# Patient Record
Sex: Male | Born: 1960
Health system: Southern US, Community
[De-identification: ages and names within clinical notes are randomized; demographics above are authoritative.]

## PROBLEM LIST (undated history)

## (undated) DIAGNOSIS — C801 Malignant (primary) neoplasm, unspecified: Secondary | ICD-10-CM

## (undated) DIAGNOSIS — R0600 Dyspnea, unspecified: Secondary | ICD-10-CM

## (undated) DIAGNOSIS — K219 Gastro-esophageal reflux disease without esophagitis: Secondary | ICD-10-CM

## (undated) DIAGNOSIS — F40298 Other specified phobia: Secondary | ICD-10-CM

## (undated) DIAGNOSIS — I10 Essential (primary) hypertension: Secondary | ICD-10-CM

## (undated) DIAGNOSIS — E079 Disorder of thyroid, unspecified: Secondary | ICD-10-CM

## (undated) DIAGNOSIS — Z974 Presence of external hearing-aid: Secondary | ICD-10-CM

## (undated) DIAGNOSIS — R569 Unspecified convulsions: Secondary | ICD-10-CM

## (undated) HISTORY — PX: HERNIA REPAIR: SHX51

## (undated) HISTORY — PX: MULTIPLE TOOTH EXTRACTIONS: SHX2053

## (undated) HISTORY — PX: FINGER SURGERY: SHX640

## (undated) HISTORY — PX: EYE SURGERY: SHX253

---

## 2017-07-11 DIAGNOSIS — R079 Chest pain, unspecified: Secondary | ICD-10-CM | POA: Diagnosis not present

## 2017-07-11 DIAGNOSIS — R0789 Other chest pain: Secondary | ICD-10-CM | POA: Diagnosis not present

## 2017-07-11 DIAGNOSIS — Z5321 Procedure and treatment not carried out due to patient leaving prior to being seen by health care provider: Secondary | ICD-10-CM | POA: Diagnosis not present

## 2017-07-11 DIAGNOSIS — I160 Hypertensive urgency: Secondary | ICD-10-CM | POA: Diagnosis not present

## 2017-07-12 DIAGNOSIS — R0789 Other chest pain: Secondary | ICD-10-CM | POA: Diagnosis not present

## 2017-07-12 DIAGNOSIS — I10 Essential (primary) hypertension: Secondary | ICD-10-CM | POA: Diagnosis not present

## 2017-07-12 DIAGNOSIS — R079 Chest pain, unspecified: Secondary | ICD-10-CM | POA: Diagnosis not present

## 2017-07-12 DIAGNOSIS — Z87891 Personal history of nicotine dependence: Secondary | ICD-10-CM | POA: Diagnosis not present

## 2017-07-14 DIAGNOSIS — E0789 Other specified disorders of thyroid: Secondary | ICD-10-CM | POA: Diagnosis not present

## 2017-07-14 DIAGNOSIS — K2970 Gastritis, unspecified, without bleeding: Secondary | ICD-10-CM | POA: Diagnosis not present

## 2017-07-14 DIAGNOSIS — R0789 Other chest pain: Secondary | ICD-10-CM | POA: Diagnosis not present

## 2017-07-14 DIAGNOSIS — I10 Essential (primary) hypertension: Secondary | ICD-10-CM | POA: Diagnosis not present

## 2017-07-19 DIAGNOSIS — R079 Chest pain, unspecified: Secondary | ICD-10-CM | POA: Diagnosis not present

## 2017-07-19 DIAGNOSIS — I10 Essential (primary) hypertension: Secondary | ICD-10-CM | POA: Diagnosis not present

## 2017-07-19 DIAGNOSIS — Z8249 Family history of ischemic heart disease and other diseases of the circulatory system: Secondary | ICD-10-CM | POA: Diagnosis not present

## 2017-07-19 DIAGNOSIS — E041 Nontoxic single thyroid nodule: Secondary | ICD-10-CM | POA: Diagnosis not present

## 2017-07-26 ENCOUNTER — Ambulatory Visit (INDEPENDENT_AMBULATORY_CARE_PROVIDER_SITE_OTHER): Payer: BLUE CROSS/BLUE SHIELD | Admitting: Otolaryngology

## 2017-07-26 DIAGNOSIS — D440 Neoplasm of uncertain behavior of thyroid gland: Secondary | ICD-10-CM

## 2017-08-06 ENCOUNTER — Other Ambulatory Visit: Payer: Self-pay | Admitting: Otolaryngology

## 2017-08-18 ENCOUNTER — Other Ambulatory Visit: Payer: Self-pay

## 2017-08-18 ENCOUNTER — Encounter (HOSPITAL_BASED_OUTPATIENT_CLINIC_OR_DEPARTMENT_OTHER): Payer: Self-pay | Admitting: *Deleted

## 2017-08-19 ENCOUNTER — Encounter (HOSPITAL_BASED_OUTPATIENT_CLINIC_OR_DEPARTMENT_OTHER)
Admission: RE | Admit: 2017-08-19 | Discharge: 2017-08-19 | Disposition: A | Discharge status: Home / Self Care | Payer: BLUE CROSS/BLUE SHIELD | Source: Ambulatory Visit | Attending: Otolaryngology | Admitting: Otolaryngology

## 2017-08-19 DIAGNOSIS — I10 Essential (primary) hypertension: Secondary | ICD-10-CM | POA: Diagnosis not present

## 2017-08-19 DIAGNOSIS — E079 Disorder of thyroid, unspecified: Secondary | ICD-10-CM | POA: Insufficient documentation

## 2017-08-19 DIAGNOSIS — Z01812 Encounter for preprocedural laboratory examination: Secondary | ICD-10-CM | POA: Insufficient documentation

## 2017-08-19 LAB — BASIC METABOLIC PANEL
Anion gap: 9 (ref 5–15)
BUN: 23 mg/dL — ABNORMAL HIGH (ref 6–20)
CALCIUM: 9.6 mg/dL (ref 8.9–10.3)
CHLORIDE: 100 mmol/L — AB (ref 101–111)
CO2: 27 mmol/L (ref 22–32)
CREATININE: 1.24 mg/dL (ref 0.61–1.24)
GFR calc Af Amer: >60 mL/min (ref >60)
GFR calc non Af Amer: >60 mL/min (ref >60)
GLUCOSE: 102 mg/dL — AB (ref 65–99)
Potassium: 4.5 mmol/L (ref 3.5–5.1)
Sodium: 136 mmol/L (ref 135–145)

## 2017-08-24 ENCOUNTER — Ambulatory Visit (HOSPITAL_BASED_OUTPATIENT_CLINIC_OR_DEPARTMENT_OTHER)
Admission: RE | Admit: 2017-08-24 | Discharge: 2017-08-25 | Disposition: A | Discharge status: Home / Self Care | Payer: BLUE CROSS/BLUE SHIELD | Source: Ambulatory Visit | Attending: Otolaryngology | Admitting: Otolaryngology

## 2017-08-24 ENCOUNTER — Encounter (HOSPITAL_BASED_OUTPATIENT_CLINIC_OR_DEPARTMENT_OTHER): Payer: Self-pay

## 2017-08-24 ENCOUNTER — Other Ambulatory Visit: Payer: Self-pay

## 2017-08-24 ENCOUNTER — Ambulatory Visit (HOSPITAL_BASED_OUTPATIENT_CLINIC_OR_DEPARTMENT_OTHER): Payer: BLUE CROSS/BLUE SHIELD | Admitting: Anesthesiology

## 2017-08-24 ENCOUNTER — Encounter (HOSPITAL_BASED_OUTPATIENT_CLINIC_OR_DEPARTMENT_OTHER)
Admission: RE | Disposition: A | Discharge status: Home / Self Care | Payer: Self-pay | Source: Ambulatory Visit | Attending: Otolaryngology

## 2017-08-24 DIAGNOSIS — M1990 Unspecified osteoarthritis, unspecified site: Secondary | ICD-10-CM | POA: Diagnosis not present

## 2017-08-24 DIAGNOSIS — D440 Neoplasm of uncertain behavior of thyroid gland: Secondary | ICD-10-CM | POA: Diagnosis not present

## 2017-08-24 DIAGNOSIS — Z8249 Family history of ischemic heart disease and other diseases of the circulatory system: Secondary | ICD-10-CM | POA: Insufficient documentation

## 2017-08-24 DIAGNOSIS — I10 Essential (primary) hypertension: Secondary | ICD-10-CM | POA: Diagnosis not present

## 2017-08-24 DIAGNOSIS — E079 Disorder of thyroid, unspecified: Secondary | ICD-10-CM | POA: Diagnosis present

## 2017-08-24 DIAGNOSIS — C73 Malignant neoplasm of thyroid gland: Secondary | ICD-10-CM | POA: Insufficient documentation

## 2017-08-24 DIAGNOSIS — Z833 Family history of diabetes mellitus: Secondary | ICD-10-CM | POA: Insufficient documentation

## 2017-08-24 DIAGNOSIS — Z79899 Other long term (current) drug therapy: Secondary | ICD-10-CM | POA: Diagnosis not present

## 2017-08-24 DIAGNOSIS — Z9089 Acquired absence of other organs: Secondary | ICD-10-CM

## 2017-08-24 DIAGNOSIS — R51 Headache: Secondary | ICD-10-CM | POA: Diagnosis not present

## 2017-08-24 DIAGNOSIS — E890 Postprocedural hypothyroidism: Secondary | ICD-10-CM

## 2017-08-24 DIAGNOSIS — E0789 Other specified disorders of thyroid: Secondary | ICD-10-CM | POA: Diagnosis not present

## 2017-08-24 DIAGNOSIS — Z9889 Other specified postprocedural states: Secondary | ICD-10-CM

## 2017-08-24 HISTORY — DX: Essential (primary) hypertension: I10

## 2017-08-24 HISTORY — DX: Disorder of thyroid, unspecified: E07.9

## 2017-08-24 HISTORY — PX: THYROIDECTOMY: SHX17

## 2017-08-24 HISTORY — DX: Other specified phobia: F40.298

## 2017-08-24 HISTORY — DX: Dyspnea, unspecified: R06.00

## 2017-08-24 LAB — POCT HEMOGLOBIN-HEMACUE: Hemoglobin: 14.7 g/dL (ref 13.0–17.0)

## 2017-08-24 SURGERY — THYROIDECTOMY
Anesthesia: General | Site: Neck | Laterality: Left

## 2017-08-24 MED ORDER — CEFAZOLIN SODIUM-DEXTROSE 2-3 GM-%(50ML) IV SOLR: INTRAVENOUS | Status: DC | PRN

## 2017-08-24 MED ORDER — KCL IN DEXTROSE-NACL 20-5-0.45 MEQ/L-%-% IV SOLN
INTRAVENOUS | Status: DC
  Administered 2017-08-24: 12:00:00 via INTRAVENOUS
  Filled 2017-08-24: qty 1000

## 2017-08-24 MED ORDER — LISINOPRIL-HYDROCHLOROTHIAZIDE 20-25 MG PO TABS
1.0000 | ORAL_TABLET | Freq: Every day | ORAL | Status: DC
  Administered 2017-08-24: 1 via ORAL

## 2017-08-24 MED ORDER — HYDROMORPHONE HCL 1 MG/ML IJ SOLN
INTRAMUSCULAR | Status: AC
  Filled 2017-08-24: qty 0.5

## 2017-08-24 MED ORDER — ONDANSETRON HCL 4 MG PO TABS: 4.0000 mg | ORAL_TABLET | ORAL | Status: DC | PRN

## 2017-08-24 MED ORDER — FENTANYL CITRATE (PF) 100 MCG/2ML IJ SOLN
INTRAMUSCULAR | Status: AC
  Filled 2017-08-24: qty 2

## 2017-08-24 MED ORDER — ONDANSETRON HCL 4 MG/2ML IJ SOLN: 4.0000 mg | Freq: Once | INTRAMUSCULAR | Status: DC | PRN

## 2017-08-24 MED ORDER — OXYCODONE-ACETAMINOPHEN 5-325 MG PO TABS
1.0000 | ORAL_TABLET | ORAL | Status: DC | PRN
  Administered 2017-08-24 – 2017-08-25 (×5): 1 via ORAL
  Filled 2017-08-24 (×5): qty 1

## 2017-08-24 MED ORDER — AMOXICILLIN 875 MG PO TABS: 875.0000 mg | ORAL_TABLET | Freq: Two times a day (BID) | ORAL | 0 refills | Status: AC

## 2017-08-24 MED ORDER — SODIUM CHLORIDE 0.9 % IV SOLN
INTRAVENOUS | Status: DC | PRN
  Administered 2017-08-24: 40 ug/min via INTRAVENOUS

## 2017-08-24 MED ORDER — CEFAZOLIN SODIUM-DEXTROSE 2-3 GM-%(50ML) IV SOLR
INTRAVENOUS | Status: DC | PRN
  Administered 2017-08-24: 2 g via INTRAVENOUS

## 2017-08-24 MED ORDER — ACETAMINOPHEN 160 MG/5ML PO SOLN: 650.0000 mg | ORAL | Status: DC | PRN

## 2017-08-24 MED ORDER — MORPHINE SULFATE (PF) 2 MG/ML IV SOLN: 2.0000 mg | INTRAVENOUS | Status: DC | PRN

## 2017-08-24 MED ORDER — LACTATED RINGERS IV SOLN
INTRAVENOUS | Status: DC
  Administered 2017-08-24 (×2): via INTRAVENOUS

## 2017-08-24 MED ORDER — DEXAMETHASONE SODIUM PHOSPHATE 10 MG/ML IJ SOLN
INTRAMUSCULAR | Status: AC
  Filled 2017-08-24: qty 1

## 2017-08-24 MED ORDER — LIDOCAINE-EPINEPHRINE 1 %-1:100000 IJ SOLN
INTRAMUSCULAR | Status: DC | PRN
  Administered 2017-08-24: 3.5 mL

## 2017-08-24 MED ORDER — ONDANSETRON HCL 4 MG/2ML IJ SOLN: 4.0000 mg | INTRAMUSCULAR | Status: DC | PRN

## 2017-08-24 MED ORDER — LIDOCAINE 2% (20 MG/ML) 5 ML SYRINGE
INTRAMUSCULAR | Status: DC | PRN
  Administered 2017-08-24: 100 mg via INTRAVENOUS

## 2017-08-24 MED ORDER — PROPOFOL 10 MG/ML IV BOLUS
INTRAVENOUS | Status: DC | PRN
  Administered 2017-08-24: 150 mg via INTRAVENOUS
  Administered 2017-08-24: 50 mg via INTRAVENOUS

## 2017-08-24 MED ORDER — PHENOL 1.4 % MT LIQD
2.0000 | OROMUCOSAL | Status: DC | PRN
  Administered 2017-08-24: 2 via OROMUCOSAL
  Filled 2017-08-24: qty 177

## 2017-08-24 MED ORDER — ONDANSETRON HCL 4 MG/2ML IJ SOLN
INTRAMUSCULAR | Status: AC
  Filled 2017-08-24: qty 2

## 2017-08-24 MED ORDER — MEPERIDINE HCL 25 MG/ML IJ SOLN: 6.2500 mg | INTRAMUSCULAR | Status: DC | PRN

## 2017-08-24 MED ORDER — MIDAZOLAM HCL 2 MG/2ML IJ SOLN
INTRAMUSCULAR | Status: AC
  Filled 2017-08-24: qty 2

## 2017-08-24 MED ORDER — CEFAZOLIN SODIUM-DEXTROSE 2-4 GM/100ML-% IV SOLN
INTRAVENOUS | Status: AC
  Filled 2017-08-24: qty 100

## 2017-08-24 MED ORDER — DEXAMETHASONE SODIUM PHOSPHATE 4 MG/ML IJ SOLN
INTRAMUSCULAR | Status: DC | PRN
  Administered 2017-08-24: 10 mg via INTRAVENOUS

## 2017-08-24 MED ORDER — PHENYLEPHRINE HCL 10 MG/ML IJ SOLN
INTRAMUSCULAR | Status: DC | PRN
  Administered 2017-08-24 (×2): 120 ug via INTRAVENOUS

## 2017-08-24 MED ORDER — OXYCODONE-ACETAMINOPHEN 5-325 MG PO TABS: 1.0000 | ORAL_TABLET | ORAL | 0 refills | Status: DC | PRN

## 2017-08-24 MED ORDER — ONDANSETRON HCL 4 MG/2ML IJ SOLN
INTRAMUSCULAR | Status: DC | PRN
  Administered 2017-08-24 (×2): 4 mg via INTRAVENOUS

## 2017-08-24 MED ORDER — HYDROMORPHONE HCL 1 MG/ML IJ SOLN
0.2500 mg | INTRAMUSCULAR | Status: DC | PRN
  Administered 2017-08-24 (×2): 0.5 mg via INTRAVENOUS

## 2017-08-24 MED ORDER — FENTANYL CITRATE (PF) 100 MCG/2ML IJ SOLN
50.0000 ug | INTRAMUSCULAR | Status: AC | PRN
  Administered 2017-08-24: 25 ug via INTRAVENOUS
  Administered 2017-08-24: 100 ug via INTRAVENOUS
  Administered 2017-08-24 (×4): 25 ug via INTRAVENOUS

## 2017-08-24 MED ORDER — SUCCINYLCHOLINE CHLORIDE 200 MG/10ML IV SOSY
PREFILLED_SYRINGE | INTRAVENOUS | Status: DC | PRN
  Administered 2017-08-24: 140 mg via INTRAVENOUS

## 2017-08-24 MED ORDER — ACETAMINOPHEN 650 MG RE SUPP: 650.0000 mg | RECTAL | Status: DC | PRN

## 2017-08-24 MED ORDER — LIDOCAINE 2% (20 MG/ML) 5 ML SYRINGE
INTRAMUSCULAR | Status: AC
  Filled 2017-08-24: qty 5

## 2017-08-24 MED ORDER — MIDAZOLAM HCL 2 MG/2ML IJ SOLN
1.0000 mg | INTRAMUSCULAR | Status: DC | PRN
  Administered 2017-08-24: 2 mg via INTRAVENOUS

## 2017-08-24 MED ORDER — SCOPOLAMINE 1 MG/3DAYS TD PT72: 1.0000 | MEDICATED_PATCH | Freq: Once | TRANSDERMAL | Status: DC | PRN

## 2017-08-24 SURGICAL SUPPLY — 60 items
ATTRACTOMAT 16X20 MAGNETIC DRP (DRAPES) ×2 IMPLANT
BLADE CLIPPER SURG (BLADE) ×2 IMPLANT
BLADE SURG 10 STRL SS (BLADE) IMPLANT
BLADE SURG 15 STRL LF DISP TIS (BLADE) ×1 IMPLANT
BLADE SURG 15 STRL SS (BLADE) ×1
CANISTER SUCT 1200ML W/VALVE (MISCELLANEOUS) ×2 IMPLANT
CLIP VESOCCLUDE SM WIDE 6/CT (CLIP) IMPLANT
CORD BIPOLAR FORCEPS 12FT (ELECTRODE) ×2 IMPLANT
COVER BACK TABLE 60X90IN (DRAPES) ×2 IMPLANT
COVER MAYO STAND STRL (DRAPES) ×2 IMPLANT
DECANTER SPIKE VIAL GLASS SM (MISCELLANEOUS) IMPLANT
DERMABOND ADVANCED (GAUZE/BANDAGES/DRESSINGS) ×1
DERMABOND ADVANCED .7 DNX12 (GAUZE/BANDAGES/DRESSINGS) ×1 IMPLANT
DRAIN CHANNEL 10F 3/8 F FF (DRAIN) ×2 IMPLANT
DRAIN CHANNEL 7F FF FLAT (WOUND CARE) IMPLANT
DRAPE U-SHAPE 76X120 STRL (DRAPES) ×2 IMPLANT
ELECT COATED BLADE 2.86 ST (ELECTRODE) ×2 IMPLANT
ELECT REM PT RETURN 9FT ADLT (ELECTROSURGICAL) ×2
ELECTRODE REM PT RTRN 9FT ADLT (ELECTROSURGICAL) ×1 IMPLANT
EVACUATOR SILICONE 100CC (DRAIN) ×2 IMPLANT
FORCEPS BIPOLAR SPETZLER 8 1.0 (NEUROSURGERY SUPPLIES) ×2 IMPLANT
GAUZE SPONGE 4X4 12PLY STRL LF (GAUZE/BANDAGES/DRESSINGS) IMPLANT
GAUZE SPONGE 4X4 16PLY XRAY LF (GAUZE/BANDAGES/DRESSINGS) ×8 IMPLANT
GLOVE BIO SURGEON STRL SZ 6.5 (GLOVE) ×4 IMPLANT
GLOVE BIO SURGEON STRL SZ7.5 (GLOVE) ×2 IMPLANT
GLOVE BIOGEL PI IND STRL 7.0 (GLOVE) ×1 IMPLANT
GLOVE BIOGEL PI INDICATOR 7.0 (GLOVE) ×1
GLOVE SURG SS PI 7.0 STRL IVOR (GLOVE) ×2 IMPLANT
GOWN STRL REUS W/ TWL LRG LVL3 (GOWN DISPOSABLE) ×3 IMPLANT
GOWN STRL REUS W/TWL LRG LVL3 (GOWN DISPOSABLE) ×3
HEMOSTAT SURGICEL 2X14 (HEMOSTASIS) IMPLANT
NEEDLE HYPO 25X1 1.5 SAFETY (NEEDLE) ×2 IMPLANT
NS IRRIG 1000ML POUR BTL (IV SOLUTION) ×2 IMPLANT
PACK BASIN DAY SURGERY FS (CUSTOM PROCEDURE TRAY) ×2 IMPLANT
PENCIL BUTTON HOLSTER BLD 10FT (ELECTRODE) ×2 IMPLANT
PIN SAFETY STERILE (MISCELLANEOUS) IMPLANT
PROBE NERVBE PRASS .33 (MISCELLANEOUS) ×2 IMPLANT
SHEARS HARMONIC 9CM CVD (BLADE) ×2 IMPLANT
SLEEVE SCD COMPRESS KNEE MED (MISCELLANEOUS) ×2 IMPLANT
SPONGE INTESTINAL PEANUT (DISPOSABLE) ×2 IMPLANT
STAPLER VISISTAT 35W (STAPLE) IMPLANT
SUT ETHILON 3 0 PS 1 (SUTURE) ×2 IMPLANT
SUT PROLENE 5 0 P 3 (SUTURE) IMPLANT
SUT SILK 2 0 SH (SUTURE) ×2 IMPLANT
SUT SILK 2 0 TIES 17X18 (SUTURE)
SUT SILK 2-0 18XBRD TIE BLK (SUTURE) IMPLANT
SUT SILK 3 0 TIES 17X18 (SUTURE) ×2
SUT SILK 3-0 18XBRD TIE BLK (SUTURE) ×2 IMPLANT
SUT VIC AB 3-0 FS2 27 (SUTURE) ×2 IMPLANT
SUT VICRYL 4-0 PS2 18IN ABS (SUTURE) ×4 IMPLANT
SYR BULB 3OZ (MISCELLANEOUS) ×2 IMPLANT
SYR CONTROL 10ML LL (SYRINGE) ×2 IMPLANT
TOWEL OR 17X24 6PK STRL BLUE (TOWEL DISPOSABLE) ×4 IMPLANT
TRAY DSU PREP LF (CUSTOM PROCEDURE TRAY) ×2 IMPLANT
TUBE CONNECTING 20X1/4 (TUBING) ×2 IMPLANT
TUBE ENDOTRAC NIMS EMG 6MM (MISCELLANEOUS) IMPLANT
TUBE ENDOTRAC NIMS EMG 7MM (MISCELLANEOUS) IMPLANT
TUBE ENDOTRAC NIMS EMG 8MM (MISCELLANEOUS) IMPLANT
TUBE ENDOTRAC NIMS EMG 9MM (MISCELLANEOUS) ×2 IMPLANT
YANKAUER SUCT BULB TIP NO VENT (SUCTIONS) ×2 IMPLANT

## 2017-08-24 NOTE — H&P (Signed)
Cc: Large thyroid nodule  HPI: The patient is a 57 y/o male who presents today with his wife for evaluation of a newly diagnosed thyroid nodule. The patient is seen in consultation requested by [place]. The patient noted onset of chest discomfort two weeks ago. He has been to the ER several times since onset of symptoms. During his exam a large left thyroid mass was noted. Subsequent ultrasound showed a 6.3 cm left solid thyroid nodule. The patient has undergone cardiac testing with no abnormalities noted. He complains of chest fullness and loss of breath when lying down. No throat pain is noted but he does occasionally feel like his food gets stuck. The patient has been having some reflux symptoms. His chest pain was felt to be musculoskeletal in origin. No previous ENT surgery is noted.   The patient's review of systems (constitutional, eyes, ENT, cardiovascular, respiratory, GI, musculoskeletal, skin, neurologic, psychiatric, endocrine, hematologic, allergic) is noted in the ROS questionnaire.  It is reviewed with the patient and his wife.   Family health history: Diabetes, heart disease.  Major events: Hernia repair X 2.  Ongoing medical problems: Arthritis, hypertension, chest pain, headache.  Social history: The patient is married. He denies the use of tobacco, alcohol or illegal drugs.   Exam General: Communicates without difficulty, well nourished, no acute distress. Head: Normocephalic, no evidence injury, no tenderness, facial buttresses intact without stepoff. Eyes: PERRL, EOMI. No scleral icterus, conjunctivae clear. Neuro: CN II exam reveals vision grossly intact.  No nystagmus at any point of gaze. Ears: Auricles well formed without lesions.  Ear canals are intact without mass or lesion.  No erythema or edema is appreciated.  The TMs are intact without fluid. Nose: External evaluation reveals normal support and skin without lesions.  Dorsum is intact.  Anterior rhinoscopy  reveals healthy pink mucosa over anterior aspect of inferior turbinates and intact septum.  No purulence noted. Oral:  Oral cavity and oropharynx are intact, symmetric, without erythema or edema.  Mucosa is moist without lesions. Neck: Full range of motion without pain.  There is no significant lymphadenopathy.  No masses palpable.  Thyroid bed with large left thyroid mass.  Parotid glands and submandibular glands equal bilaterally without mass.  Trachea is midline.   Assessment The patient is noted to have a 6.3 cm left thyroid nodule which is causing some compressive symptoms. The rest of his ENT exam is normal.  Plan  1. The physical exam and ultrasound findings are reviewed extensively with the patient and his wife.  2. Treatment options are discussed. In light of the size of the nodule and the compressive symptoms, left hemithyroid is recommended. The risks, benefits, alternatives, and details of the procedure are reviewed with the patient and his wife. Questions are invited and answered. 3. The patient is interested in proceeding with the procedure.  We will schedule the procedure in accordance with the family schedule.

## 2017-08-24 NOTE — H&P (View-Only) (Signed)
Cc: Large thyroid nodule  HPI: The patient is a 57 y/o male who presents today with his wife for evaluation of a newly diagnosed thyroid nodule. The patient is seen in consultation requested by [place]. The patient noted onset of chest discomfort two weeks ago. He has been to the ER several times since onset of symptoms. During his exam a large left thyroid mass was noted. Subsequent ultrasound showed a 6.3 cm left solid thyroid nodule. The patient has undergone cardiac testing with no abnormalities noted. He complains of chest fullness and loss of breath when lying down. No throat pain is noted but he does occasionally feel like his food gets stuck. The patient has been having some reflux symptoms. His chest pain was felt to be musculoskeletal in origin. No previous ENT surgery is noted.   The patient's review of systems (constitutional, eyes, ENT, cardiovascular, respiratory, GI, musculoskeletal, skin, neurologic, psychiatric, endocrine, hematologic, allergic) is noted in the ROS questionnaire.  It is reviewed with the patient and his wife.   Family health history: Diabetes, heart disease.  Major events: Hernia repair X 2.  Ongoing medical problems: Arthritis, hypertension, chest pain, headache.  Social history: The patient is married. He denies the use of tobacco, alcohol or illegal drugs.   Exam General: Communicates without difficulty, well nourished, no acute distress. Head: Normocephalic, no evidence injury, no tenderness, facial buttresses intact without stepoff. Eyes: PERRL, EOMI. No scleral icterus, conjunctivae clear. Neuro: CN II exam reveals vision grossly intact.  No nystagmus at any point of gaze. Ears: Auricles well formed without lesions.  Ear canals are intact without mass or lesion.  No erythema or edema is appreciated.  The TMs are intact without fluid. Nose: External evaluation reveals normal support and skin without lesions.  Dorsum is intact.  Anterior rhinoscopy  reveals healthy pink mucosa over anterior aspect of inferior turbinates and intact septum.  No purulence noted. Oral:  Oral cavity and oropharynx are intact, symmetric, without erythema or edema.  Mucosa is moist without lesions. Neck: Full range of motion without pain.  There is no significant lymphadenopathy.  No masses palpable.  Thyroid bed with large left thyroid mass.  Parotid glands and submandibular glands equal bilaterally without mass.  Trachea is midline.   Assessment The patient is noted to have a 6.3 cm left thyroid nodule which is causing some compressive symptoms. The rest of his ENT exam is normal.  Plan  1. The physical exam and ultrasound findings are reviewed extensively with the patient and his wife.  2. Treatment options are discussed. In light of the size of the nodule and the compressive symptoms, left hemithyroid is recommended. The risks, benefits, alternatives, and details of the procedure are reviewed with the patient and his wife. Questions are invited and answered. 3. The patient is interested in proceeding with the procedure.  We will schedule the procedure in accordance with the family schedule.

## 2017-08-24 NOTE — Transfer of Care (Signed)
Immediate Anesthesia Transfer of Care Note  Patient: Carlos Payne  Procedure(s) Performed: LEFT HEMI THYROIDECTOMY (Left Neck)  Patient Location: PACU  Anesthesia Type:General  Level of Consciousness: awake and patient cooperative  Airway & Oxygen Therapy: Patient Spontanous Breathing and Patient connected to face mask oxygen  Post-op Assessment: Report given to RN and Post -op Vital signs reviewed and stable  Post vital signs: Reviewed and stable  Last Vitals:  Vitals:   08/24/17 0747 08/24/17 1117  BP: (!) 142/82   Pulse: 95 (!) (P) 102  Resp: 20 15  Temp: 36.6 C (P) 37.6 C  SpO2: 97% 97%    Last Pain:  Vitals:   08/24/17 0747  TempSrc: Oral         Complications: No apparent anesthesia complications

## 2017-08-24 NOTE — Discharge Instructions (Addendum)
Thyroidectomy, Care After Refer to this sheet in the next few weeks. These instructions provide you with information about caring for yourself after your procedure. Your health care provider may also give you more specific instructions. Your treatment has been planned according to current medical practices, but problems sometimes occur. Call your health care provider if you have any problems or questions after your procedure. What can I expect after the procedure? After your procedure, it is typical to have:  Mild pain in the neck or upper body, especially when swallowing.  A sore throat.  A weak voice.  Follow these instructions at home:  Take medicines only as directed by your health care provider.  Do not take medicines that contain aspirin and ibuprofen until your health care provider says that you can. These medicines can increase your risk of bleeding.  Some pain medicines cause constipation. Drink enough fluid to keep your urine clear or pale yellow. This can help to prevent constipation.  Start slowly with eating. You may need to have only liquids and soft foods for a few days or as directed by your health care provider.  There are many different ways to close and cover an incision, including stitches (sutures), skin glue, and adhesive strips. Follow your health care provider's instructions for: ? Incision care. ? Bandage (dressing) changes and removal. ? Incision closure removal.  Resume your usual activities as directed by your health care provider.  For the first 10 days after the procedure or as instructed by your health care provider: ? Do not lift anything heavier than 20 lb (9.1 kg). ? Do not jog, swim, or do other strenuous exercises. ? Do not play contact sports.  Keep all follow-up visits as directed by your health care provider. This is important. Contact a health care provider if:  The soreness in your throat gets worse.  You have increased pain at your  incision or incisions.  You have increased bleeding from an incision.  Your incision becomes infected. Watch for: ? Swelling. ? Redness. ? Warmth. ? Pus.  You notice a bad smell coming from an incision or dressing.  You have a fever.  You feel lightheaded or faint.  You have numbness, tingling, or muscle spasms in your: ? Arms. ? Hands. ? Feet. ? Face.  You have trouble swallowing. Get help right away if:  You develop a rash.  You have difficulty breathing.  You hear whistling noises coming from your chest.  You develop a cough that gets worse.  Your speech changes, or you have hoarseness that gets worse. This information is not intended to replace advice given to you by your health care provider. Make sure you discuss any questions you have with your health care provider. Document Released: 02/13/2005 Document Revised: 03/29/2016 Document Reviewed: 12/27/2013 Elsevier Interactive Patient Education  2018 [facility] [facility] Instructions  Activity: Get plenty of rest for the remainder of the day. A responsible individual must stay with you for 24 hours following the procedure.  For the next 24 hours, DO NOT: -Drive a car -Paediatric nurse -Drink alcoholic beverages -Take any medication unless instructed by your physician -Make any legal decisions or sign important papers.  Meals: Start with liquid foods such as gelatin or soup. Progress to regular foods as tolerated. Avoid greasy, spicy, heavy foods. If nausea and/or vomiting occur, drink only clear liquids until the nausea and/or vomiting subsides. Call your physician if vomiting continues.  Special Instructions/Symptoms: Your throat  may feel dry or sore from the anesthesia or the breathing tube placed in your throat during surgery. If this causes discomfort, gargle with warm salt water. The discomfort should disappear within 24 hours.  If you had a scopolamine patch placed behind your  ear for the management of post- operative nausea and/or vomiting:  1. The medication in the patch is effective for 72 hours, after which it should be removed.  Wrap patch in a tissue and discard in the trash. Wash hands thoroughly with soap and water. 2. You may remove the patch earlier than 72 hours if you experience unpleasant side effects which may include dry mouth, dizziness or visual disturbances. 3. Avoid touching the patch. Wash your hands with soap and water after contact with the patch.

## 2017-08-24 NOTE — Anesthesia Postprocedure Evaluation (Signed)
Anesthesia Post Note  Patient: Carlos Payne  Procedure(s) Performed: LEFT HEMI THYROIDECTOMY (Left Neck)     Patient location during evaluation: PACU Anesthesia Type: General Level of consciousness: awake and alert Pain management: pain level controlled Vital Signs Assessment: post-procedure vital signs reviewed and stable Respiratory status: spontaneous breathing, nonlabored ventilation, respiratory function stable and patient connected to nasal cannula oxygen Cardiovascular status: blood pressure returned to baseline and stable Postop Assessment: no apparent nausea or vomiting Anesthetic complications: no    Last Vitals:  Vitals:   08/24/17 1429 08/24/17 1430  BP:    Pulse: 90 89  Resp:    Temp:    SpO2: (!) 89% 96%    Last Pain:  Vitals:   08/24/17 1430  TempSrc:   PainSc: 5                  Ryan P Ellender

## 2017-08-24 NOTE — Anesthesia Preprocedure Evaluation (Signed)
Anesthesia Evaluation  Patient identified by MRN, date of birth, ID band Patient awake    Reviewed: Allergy & Precautions, NPO status , Patient's Chart, lab work & pertinent test results  Airway Mallampati: I  TM Distance: >3 FB Neck ROM: Full    Dental   Pulmonary    Pulmonary exam normal        Cardiovascular hypertension, Pt. on medications Normal cardiovascular exam     Neuro/Psych    GI/Hepatic   Endo/Other    Renal/GU      Musculoskeletal   Abdominal   Peds  Hematology   Anesthesia Other Findings   Reproductive/Obstetrics                             Anesthesia Physical Anesthesia Plan  ASA: II  Anesthesia Plan: General   Post-op Pain Management:    Induction: Intravenous  PONV Risk Score and Plan: 2 and Ondansetron and Dexamethasone  Airway Management Planned: Oral ETT  Additional Equipment:   Intra-op Plan:   Post-operative Plan: Extubation in OR  Informed Consent: I have reviewed the patients History and Physical, chart, labs and discussed the procedure including the risks, benefits and alternatives for the proposed anesthesia with the patient or authorized representative who has indicated his/her understanding and acceptance.     Plan Discussed with: CRNA and Surgeon  Anesthesia Plan Comments:         Anesthesia Quick Evaluation

## 2017-08-24 NOTE — Anesthesia Procedure Notes (Addendum)
Procedure Name: Intubation Date/Time: 08/24/2017 8:55 AM Performed by: Lyndee Leo, CRNA Pre-anesthesia Checklist: Patient identified, Emergency Drugs available, Suction available and Patient being monitored Patient Re-evaluated:Patient Re-evaluated prior to induction Oxygen Delivery Method: Circle system utilized Preoxygenation: Pre-oxygenation with 100% oxygen Induction Type: IV induction Ventilation: Mask ventilation without difficulty Laryngoscope Size: Mac and 4 Grade View: Grade III Tube type: Oral Tube size: 9.0 mm Number of attempts: 1 Airway Equipment and Method: Stylet and Oral airway Placement Confirmation: ETT inserted through vocal cords under direct vision,  positive ETCO2 and breath sounds checked- equal and bilateral Secured at: 22 cm Tube secured with: Tape Dental Injury: Teeth and Oropharynx as per pre-operative assessment  Difficulty Due To: Difficulty was anticipated and Difficult Airway- due to reduced neck mobility

## 2017-08-24 NOTE — Op Note (Signed)
DATE OF PROCEDURE:  08/24/2017                              OPERATIVE REPORT  SURGEON:  Leta Baptist, MD  PREOPERATIVE DIAGNOSES: 1. Left thyroid mass  POSTOPERATIVE DIAGNOSES: 1. Left thyroid mass  PROCEDURE PERFORMED:  Left total thyroid lobectomy  ANESTHESIA:  General endotracheal tube anesthesia.  COMPLICATIONS:  None.  ESTIMATED BLOOD LOSS:  400 ml.  INDICATION FOR PROCEDURE:  Carlos Payne is a 57 y.o. male who was recently diagnosed with a left thyroid mass. The patient first noted onset of chest discomfort one month ago. He was seen at the emergency room several times since the onset of his symptoms. An ultrasound evaluation showed a large 6.3 cm left thyroid nodule. The patient's cardiac evaluation was negative. He continued to have a pressure sensation in his chest and his throat. His symptoms were worse when he was in a supine position. On examination, the patient was noted to have a large left thyroid lobe, compressing on his trachea and esophagus. Based on the above findings, the decision was made for the patient to undergo the hemithyroidectomy procedure. Likelihood of success in reducing symptoms was also discussed.  The risks, benefits, alternatives, and details of the procedure were discussed with the patient.  Questions were invited and answered.  Informed consent was obtained.  DESCRIPTION:  The patient was taken to the operating room and placed supine on the operating table.  General endotracheal tube anesthesia was administered by the anesthesiologist.  The patient was positioned and prepped and draped in a standard fashion for thyroidectomy surgery.  1% lidocaine with 1-100,000 epinephrine was infiltrated at the planned site of incision. A lower neck transverse incision was made. The incision was carried down to the level of the platysma muscles. Superior and inferiorly based subplatysmal flaps were elevated in the standard fashion. The strap muscles were retracted laterally,  exposing a large left thyroid lobe. Careful dissection was carried out to free the left thyroid lobe from the surrounding soft tissue. The middle thyroid vein and the superior vascular bundles were suture-ligated. A parathyroid gland was noted inferiorly and preserved. The left recurrent laryngeal nerve was also identified and preserved. The nerve was functional throughout the case. The entire left thyroid lobe was excised and sent to the pathology department for permanent histologic identification. The surgical site was copiously irrigated. A #10 JP drain was placed. The incision was closed in layers with 4-0 Vicryl and Dermabond.  The care of the patient was turned over to the anesthesiologist.  The patient was awakened from anesthesia without difficulty.  The patient was extubated and transferred to the recovery room in good condition.  OPERATIVE FINDINGS:  A large 6 cm left thyroid mass  SPECIMEN:  Left thyroid lobe.  FOLLOWUP CARE:  The patient will be admitted for overnight observation. He will most likely be discharged home on postop day #1.  Carlos Payne 08/24/2017 11:21 AM

## 2017-08-25 ENCOUNTER — Encounter (HOSPITAL_BASED_OUTPATIENT_CLINIC_OR_DEPARTMENT_OTHER): Payer: Self-pay | Admitting: Otolaryngology

## 2017-08-25 DIAGNOSIS — Z8249 Family history of ischemic heart disease and other diseases of the circulatory system: Secondary | ICD-10-CM | POA: Diagnosis not present

## 2017-08-25 DIAGNOSIS — Z79899 Other long term (current) drug therapy: Secondary | ICD-10-CM | POA: Diagnosis not present

## 2017-08-25 DIAGNOSIS — I10 Essential (primary) hypertension: Secondary | ICD-10-CM | POA: Diagnosis not present

## 2017-08-25 DIAGNOSIS — R51 Headache: Secondary | ICD-10-CM | POA: Diagnosis not present

## 2017-08-25 DIAGNOSIS — C73 Malignant neoplasm of thyroid gland: Secondary | ICD-10-CM | POA: Diagnosis not present

## 2017-08-25 DIAGNOSIS — M1990 Unspecified osteoarthritis, unspecified site: Secondary | ICD-10-CM | POA: Diagnosis not present

## 2017-08-25 DIAGNOSIS — Z833 Family history of diabetes mellitus: Secondary | ICD-10-CM | POA: Diagnosis not present

## 2017-08-25 NOTE — Discharge Summary (Signed)
Physician Discharge Summary  Patient ID: Carlos Payne MRN: [id number] DOB/AGE: Apr 26, 1961 57 y.o.  Admit date: 08/24/2017 Discharge date: 08/25/2017  Admission Diagnoses: Left thyroid mass  Discharge Diagnoses: Left thyroid mass Active Problems:   S/P partial thyroidectomy   Discharged Condition: good  Hospital Course: Pt had an uneventful overnight stay. Pt tolerated po well. No bleeding. No stridor. Voice is strong.  Consults: None  Significant Diagnostic Studies: None  Treatments: surgery: Left hemithyroidectomy  Discharge Exam: Blood pressure 124/81, pulse 80, temperature (!) 97.5 F (36.4 C), resp. rate 18, height 5\' 8"  (1.727 m), weight 115.2 kg (254 lb), SpO2 97 %. Incision/Wound:c/d/i  Disposition: Final discharge disposition not confirmed  Discharge Instructions    Activity as tolerated - No restrictions   Complete by:  As directed    Diet general   Complete by:  As directed      Allergies as of 08/25/2017   No Known Allergies     Medication List    TAKE these medications   acetaminophen 325 MG tablet Commonly known as:  TYLENOL Take 650 mg by mouth every 6 (six) hours as needed.   amoxicillin 875 MG tablet Commonly known as:  AMOXIL Take 1 tablet (875 mg total) by mouth 2 (two) times daily for 5 days.   lisinopril-hydrochlorothiazide 20-25 MG tablet Commonly known as:  PRINZIDE,ZESTORETIC Take 1 tablet by mouth daily.   oxyCODONE-acetaminophen 5-325 MG tablet Commonly known as:  ROXICET Take 1-2 tablets by mouth every 4 (four) hours as needed for severe pain.   ROLAIDS PLUS GAS RELIEF EX ST PO Take by mouth.      Follow-up Information    Leta Baptist, MD Follow up on 09/02/2017.   Specialty:  Otolaryngology Why:  at 1:20pm Contact information: [address] [place] [address] [phone]           Signed: Burley Saver 08/25/2017, 8:16 AM

## 2017-08-27 ENCOUNTER — Other Ambulatory Visit: Payer: Self-pay | Admitting: Otolaryngology

## 2017-09-02 ENCOUNTER — Encounter (HOSPITAL_COMMUNITY): Payer: Self-pay | Admitting: *Deleted

## 2017-09-02 ENCOUNTER — Other Ambulatory Visit: Payer: Self-pay

## 2017-09-02 NOTE — Progress Notes (Signed)
Pt denies any acute cardiopulmonary issues. Pt denies having a cardiac cath. Pt requested that spouse, Lelon Frohlich, be given pre-op instructions. Spouse stated that pt does not take Aspirin. Spouse made aware to have pt stop taking vitamins, fish oil and herbal medications. Do not take any NSAIDs ie: Ibuprofen, Advil, Naproxen (Aleve), Motrin, BC and Goody Powder. Spouse verbalized understanding of all pre-op instructions.

## 2017-09-03 NOTE — Anesthesia Preprocedure Evaluation (Addendum)
Anesthesia Evaluation  Patient identified by MRN, date of birth, ID band Patient awake    Reviewed: Allergy & Precautions, NPO status , Patient's Chart, lab work & pertinent test results  History of Anesthesia Complications (+) DIFFICULT AIRWAY  Airway Mallampati: II  TM Distance: >3 FB Neck ROM: Full    Dental   Pulmonary neg pulmonary ROS,    Pulmonary exam normal        Cardiovascular hypertension, Pt. on medications Normal cardiovascular exam     Neuro/Psych Seizures - (childhood),  negative psych ROS   GI/Hepatic Neg liver ROS, GERD (diet-related)  Controlled,  Endo/Other  negative endocrine ROS  Renal/GU negative Renal ROS     Musculoskeletal negative musculoskeletal ROS (+)   Abdominal (+) + obese,   Peds  Hematology negative hematology ROS (+)   Anesthesia Other Findings thyroid cancer  Reproductive/Obstetrics                           Anesthesia Physical  Anesthesia Plan  ASA: II  Anesthesia Plan: General   Post-op Pain Management:    Induction: Intravenous  PONV Risk Score and Plan: 2 and Ondansetron, Dexamethasone, Treatment may vary due to age or medical condition and Midazolam  Airway Management Planned: Oral ETT  Additional Equipment:   Intra-op Plan:   Post-operative Plan: Extubation in OR  Informed Consent: I have reviewed the patients History and Physical, chart, labs and discussed the procedure including the risks, benefits and alternatives for the proposed anesthesia with the patient or authorized representative who has indicated his/her understanding and acceptance.     Plan Discussed with: CRNA  Anesthesia Plan Comments:         Anesthesia Quick Evaluation

## 2017-09-04 ENCOUNTER — Ambulatory Visit (HOSPITAL_COMMUNITY): Payer: BLUE CROSS/BLUE SHIELD | Admitting: Anesthesiology

## 2017-09-04 ENCOUNTER — Encounter (HOSPITAL_COMMUNITY)
Admission: RE | Disposition: A | Discharge status: Home / Self Care | Payer: Self-pay | Source: Ambulatory Visit | Attending: Otolaryngology

## 2017-09-04 ENCOUNTER — Ambulatory Visit (HOSPITAL_COMMUNITY)
Admission: RE | Admit: 2017-09-04 | Discharge: 2017-09-05 | Disposition: A | Discharge status: Home / Self Care | Payer: BLUE CROSS/BLUE SHIELD | Source: Ambulatory Visit | Attending: Otolaryngology | Admitting: Otolaryngology

## 2017-09-04 ENCOUNTER — Encounter (HOSPITAL_COMMUNITY): Payer: Self-pay | Admitting: *Deleted

## 2017-09-04 DIAGNOSIS — R0789 Other chest pain: Secondary | ICD-10-CM | POA: Diagnosis not present

## 2017-09-04 DIAGNOSIS — I10 Essential (primary) hypertension: Secondary | ICD-10-CM | POA: Insufficient documentation

## 2017-09-04 DIAGNOSIS — C73 Malignant neoplasm of thyroid gland: Secondary | ICD-10-CM | POA: Diagnosis not present

## 2017-09-04 DIAGNOSIS — E890 Postprocedural hypothyroidism: Secondary | ICD-10-CM | POA: Insufficient documentation

## 2017-09-04 DIAGNOSIS — Z79899 Other long term (current) drug therapy: Secondary | ICD-10-CM | POA: Diagnosis not present

## 2017-09-04 DIAGNOSIS — E119 Type 2 diabetes mellitus without complications: Secondary | ICD-10-CM | POA: Diagnosis not present

## 2017-09-04 HISTORY — DX: Gastro-esophageal reflux disease without esophagitis: K21.9

## 2017-09-04 HISTORY — PX: THYROIDECTOMY: SHX17

## 2017-09-04 HISTORY — DX: Malignant (primary) neoplasm, unspecified: C80.1

## 2017-09-04 HISTORY — DX: Presence of external hearing-aid: Z97.4

## 2017-09-04 HISTORY — DX: Unspecified convulsions: R56.9

## 2017-09-04 LAB — CALCIUM
Calcium: 8.8 mg/dL — ABNORMAL LOW (ref 8.9–10.3)
Calcium: 8.6 mg/dL — ABNORMAL LOW (ref 8.9–10.3)

## 2017-09-04 SURGERY — THYROIDECTOMY
Anesthesia: General | Site: Neck

## 2017-09-04 MED ORDER — HYDROMORPHONE HCL 1 MG/ML IJ SOLN
0.2500 mg | INTRAMUSCULAR | Status: DC | PRN
  Administered 2017-09-04 (×4): 0.5 mg via INTRAVENOUS

## 2017-09-04 MED ORDER — PHENYLEPHRINE HCL 10 MG/ML IJ SOLN
INTRAMUSCULAR | Status: DC | PRN
  Administered 2017-09-04: 80 ug via INTRAVENOUS

## 2017-09-04 MED ORDER — SUGAMMADEX SODIUM 200 MG/2ML IV SOLN
INTRAVENOUS | Status: DC | PRN
  Administered 2017-09-04: 200 mg via INTRAVENOUS

## 2017-09-04 MED ORDER — KCL IN DEXTROSE-NACL 20-5-0.45 MEQ/L-%-% IV SOLN
INTRAVENOUS | Status: DC
  Administered 2017-09-04 – 2017-09-05 (×3): via INTRAVENOUS
  Filled 2017-09-04 (×3): qty 1000

## 2017-09-04 MED ORDER — LISINOPRIL-HYDROCHLOROTHIAZIDE 20-25 MG PO TABS: 1.0000 | ORAL_TABLET | Freq: Every day | ORAL | Status: DC

## 2017-09-04 MED ORDER — HYDROMORPHONE HCL 1 MG/ML IJ SOLN
INTRAMUSCULAR | Status: AC
  Administered 2017-09-04: 0.5 mg via INTRAVENOUS
  Filled 2017-09-04: qty 1

## 2017-09-04 MED ORDER — LISINOPRIL 20 MG PO TABS
20.0000 mg | ORAL_TABLET | Freq: Every day | ORAL | Status: DC
  Administered 2017-09-04 – 2017-09-05 (×2): 20 mg via ORAL
  Filled 2017-09-04 (×2): qty 1

## 2017-09-04 MED ORDER — PROPOFOL 10 MG/ML IV BOLUS
INTRAVENOUS | Status: DC | PRN
  Administered 2017-09-04: 150 mg via INTRAVENOUS
  Administered 2017-09-04: 50 mg via INTRAVENOUS

## 2017-09-04 MED ORDER — OXYCODONE-ACETAMINOPHEN 5-325 MG PO TABS
1.0000 | ORAL_TABLET | ORAL | Status: DC | PRN
  Administered 2017-09-04 (×3): 1 via ORAL
  Administered 2017-09-05: 2 via ORAL
  Administered 2017-09-05 (×2): 1 via ORAL
  Administered 2017-09-05: 2 via ORAL
  Filled 2017-09-04: qty 1
  Filled 2017-09-04 (×3): qty 2
  Filled 2017-09-04 (×2): qty 1
  Filled 2017-09-04: qty 2

## 2017-09-04 MED ORDER — PHENYLEPHRINE HCL 10 MG/ML IJ SOLN
INTRAVENOUS | Status: DC | PRN
  Administered 2017-09-04: 25 ug/min via INTRAVENOUS

## 2017-09-04 MED ORDER — MIDAZOLAM HCL 2 MG/2ML IJ SOLN
INTRAMUSCULAR | Status: AC
  Filled 2017-09-04: qty 2

## 2017-09-04 MED ORDER — OXYCODONE HCL 5 MG PO TABS: 5.0000 mg | ORAL_TABLET | Freq: Once | ORAL | Status: DC | PRN

## 2017-09-04 MED ORDER — DEXAMETHASONE SODIUM PHOSPHATE 10 MG/ML IJ SOLN
INTRAMUSCULAR | Status: AC
  Filled 2017-09-04: qty 1

## 2017-09-04 MED ORDER — CEFAZOLIN SODIUM-DEXTROSE 2-3 GM-%(50ML) IV SOLR
INTRAVENOUS | Status: DC | PRN
  Administered 2017-09-04: 2 g via INTRAVENOUS

## 2017-09-04 MED ORDER — LIDOCAINE-EPINEPHRINE 1 %-1:100000 IJ SOLN
INTRAMUSCULAR | Status: DC | PRN
  Administered 2017-09-04: 5 mL

## 2017-09-04 MED ORDER — LIDOCAINE-EPINEPHRINE 1 %-1:100000 IJ SOLN
INTRAMUSCULAR | Status: AC
  Filled 2017-09-04: qty 1

## 2017-09-04 MED ORDER — LIDOCAINE HCL (CARDIAC) 20 MG/ML IV SOLN
INTRAVENOUS | Status: DC | PRN
  Administered 2017-09-04: 40 mg via INTRAVENOUS

## 2017-09-04 MED ORDER — DEXAMETHASONE SODIUM PHOSPHATE 10 MG/ML IJ SOLN
INTRAMUSCULAR | Status: DC | PRN
  Administered 2017-09-04: 10 mg via INTRAVENOUS

## 2017-09-04 MED ORDER — MORPHINE SULFATE (PF) 2 MG/ML IV SOLN: 2.0000 mg | INTRAVENOUS | Status: DC | PRN

## 2017-09-04 MED ORDER — SUCCINYLCHOLINE CHLORIDE 200 MG/10ML IV SOSY
PREFILLED_SYRINGE | INTRAVENOUS | Status: AC
  Filled 2017-09-04: qty 10

## 2017-09-04 MED ORDER — PROPOFOL 10 MG/ML IV BOLUS
INTRAVENOUS | Status: AC
  Filled 2017-09-04: qty 20

## 2017-09-04 MED ORDER — FENTANYL CITRATE (PF) 100 MCG/2ML IJ SOLN
INTRAMUSCULAR | Status: DC | PRN
  Administered 2017-09-04 (×3): 50 ug via INTRAVENOUS
  Administered 2017-09-04: 100 ug via INTRAVENOUS

## 2017-09-04 MED ORDER — 0.9 % SODIUM CHLORIDE (POUR BTL) OPTIME
TOPICAL | Status: DC | PRN
  Administered 2017-09-04: 1000 mL

## 2017-09-04 MED ORDER — ONDANSETRON HCL 4 MG/2ML IJ SOLN
INTRAMUSCULAR | Status: DC | PRN
  Administered 2017-09-04: 4 mg via INTRAVENOUS

## 2017-09-04 MED ORDER — HYDROMORPHONE HCL 1 MG/ML IJ SOLN
INTRAMUSCULAR | Status: AC
Start: 2017-09-04 — End: 2017-09-04
  Administered 2017-09-04: 0.5 mg via INTRAVENOUS
  Filled 2017-09-04: qty 1

## 2017-09-04 MED ORDER — MIDAZOLAM HCL 5 MG/5ML IJ SOLN
INTRAMUSCULAR | Status: DC | PRN
  Administered 2017-09-04: 2 mg via INTRAVENOUS

## 2017-09-04 MED ORDER — DOCUSATE SODIUM 50 MG PO CAPS
50.0000 mg | ORAL_CAPSULE | Freq: Two times a day (BID) | ORAL | Status: DC
  Administered 2017-09-04 – 2017-09-05 (×2): 50 mg via ORAL
  Filled 2017-09-04 (×5): qty 1

## 2017-09-04 MED ORDER — ONDANSETRON HCL 4 MG/2ML IJ SOLN
INTRAMUSCULAR | Status: AC
  Filled 2017-09-04: qty 2

## 2017-09-04 MED ORDER — PROMETHAZINE HCL 25 MG/ML IJ SOLN: 6.2500 mg | INTRAMUSCULAR | Status: DC | PRN

## 2017-09-04 MED ORDER — HYDROCHLOROTHIAZIDE 25 MG PO TABS
25.0000 mg | ORAL_TABLET | Freq: Every day | ORAL | Status: DC
  Administered 2017-09-04 – 2017-09-05 (×2): 25 mg via ORAL
  Filled 2017-09-04 (×2): qty 1

## 2017-09-04 MED ORDER — OXYCODONE HCL 5 MG/5ML PO SOLN: 5.0000 mg | Freq: Once | ORAL | Status: DC | PRN

## 2017-09-04 MED ORDER — LACTATED RINGERS IV SOLN
INTRAVENOUS | Status: DC | PRN
  Administered 2017-09-04 (×2): via INTRAVENOUS

## 2017-09-04 MED ORDER — ROCURONIUM BROMIDE 10 MG/ML (PF) SYRINGE
PREFILLED_SYRINGE | INTRAVENOUS | Status: AC
  Filled 2017-09-04: qty 5

## 2017-09-04 MED ORDER — CALCIUM CARBONATE-VITAMIN D 500-200 MG-UNIT PO TABS
2.0000 | ORAL_TABLET | Freq: Two times a day (BID) | ORAL | Status: DC
  Administered 2017-09-04 – 2017-09-05 (×2): 2 via ORAL
  Filled 2017-09-04 (×2): qty 2

## 2017-09-04 MED ORDER — SUCCINYLCHOLINE CHLORIDE 20 MG/ML IJ SOLN
INTRAMUSCULAR | Status: DC | PRN
  Administered 2017-09-04: 120 mg via INTRAVENOUS

## 2017-09-04 MED ORDER — PHENYLEPHRINE 40 MCG/ML (10ML) SYRINGE FOR IV PUSH (FOR BLOOD PRESSURE SUPPORT)
PREFILLED_SYRINGE | INTRAVENOUS | Status: AC
  Filled 2017-09-04: qty 10

## 2017-09-04 MED ORDER — FENTANYL CITRATE (PF) 250 MCG/5ML IJ SOLN
INTRAMUSCULAR | Status: AC
  Filled 2017-09-04: qty 5

## 2017-09-04 MED ORDER — ROCURONIUM BROMIDE 100 MG/10ML IV SOLN
INTRAVENOUS | Status: DC | PRN
  Administered 2017-09-04 (×3): 10 mg via INTRAVENOUS

## 2017-09-04 SURGICAL SUPPLY — 36 items
ATTRACTOMAT 16X20 MAGNETIC DRP (DRAPES) ×2 IMPLANT
CANISTER SUCT 3000ML PPV (MISCELLANEOUS) ×2 IMPLANT
CLEANER TIP ELECTROSURG 2X2 (MISCELLANEOUS) ×2 IMPLANT
CONT SPEC 4OZ CLIKSEAL STRL BL (MISCELLANEOUS) ×2 IMPLANT
CORDS BIPOLAR (ELECTRODE) ×2 IMPLANT
COVER SURGICAL LIGHT HANDLE (MISCELLANEOUS) ×2 IMPLANT
CRADLE DONUT ADULT HEAD (MISCELLANEOUS) ×2 IMPLANT
DERMABOND ADVANCED (GAUZE/BANDAGES/DRESSINGS) ×1
DERMABOND ADVANCED .7 DNX12 (GAUZE/BANDAGES/DRESSINGS) ×1 IMPLANT
DRAIN CHANNEL 10F 3/8 F FF (DRAIN) ×2 IMPLANT
DRAPE HALF SHEET 40X57 (DRAPES) ×2 IMPLANT
ELECT COATED BLADE 2.86 ST (ELECTRODE) ×2 IMPLANT
ELECT REM PT RETURN 9FT ADLT (ELECTROSURGICAL) ×2
ELECTRODE REM PT RTRN 9FT ADLT (ELECTROSURGICAL) ×1 IMPLANT
EVACUATOR SILICONE 100CC (DRAIN) ×2 IMPLANT
FORCEPS BIPOLAR SPETZLER 8 1.0 (NEUROSURGERY SUPPLIES) ×2 IMPLANT
GAUZE SPONGE 4X4 16PLY XRAY LF (GAUZE/BANDAGES/DRESSINGS) ×4 IMPLANT
GLOVE ECLIPSE 7.5 STRL STRAW (GLOVE) ×2 IMPLANT
GOWN STRL REUS W/ TWL LRG LVL3 (GOWN DISPOSABLE) ×3 IMPLANT
GOWN STRL REUS W/TWL LRG LVL3 (GOWN DISPOSABLE) ×3
KIT BASIN OR (CUSTOM PROCEDURE TRAY) ×2 IMPLANT
KIT ROOM TURNOVER OR (KITS) ×2 IMPLANT
NEEDLE HYPO 25GX1X1/2 BEV (NEEDLE) ×2 IMPLANT
NS IRRIG 1000ML POUR BTL (IV SOLUTION) ×2 IMPLANT
PAD ARMBOARD 7.5X6 YLW CONV (MISCELLANEOUS) ×2 IMPLANT
PENCIL BUTTON HOLSTER BLD 10FT (ELECTRODE) ×2 IMPLANT
PROBE NERVBE PRASS .33 (MISCELLANEOUS) ×2 IMPLANT
SHEARS HARMONIC 9CM CVD (BLADE) ×2 IMPLANT
SPONGE INTESTINAL PEANUT (DISPOSABLE) ×6 IMPLANT
SUT ETHILON 2 0 FS 18 (SUTURE) ×2 IMPLANT
SUT SILK 2 0 PERMA HAND 18 BK (SUTURE) ×2 IMPLANT
SUT SILK 3 0 REEL (SUTURE) ×2 IMPLANT
SUT VICRYL 4-0 PS2 18IN ABS (SUTURE) ×2 IMPLANT
SYR BULB IRRIGATION 50ML (SYRINGE) ×2 IMPLANT
TRAY ENT MC OR (CUSTOM PROCEDURE TRAY) ×2 IMPLANT
TUBE ENDOTRAC EMG 8X11.3 (MISCELLANEOUS) ×2 IMPLANT

## 2017-09-04 NOTE — Progress Notes (Signed)
No repeat labs per Dr. Ellender. 

## 2017-09-04 NOTE — Transfer of Care (Signed)
Immediate Anesthesia Transfer of Care Note  Patient: Carlos Payne  Procedure(s) Performed: COMPLETION THYROIDECTOMY (N/A Neck)  Patient Location: PACU  Anesthesia Type:General  Level of Consciousness: awake, alert  and oriented  Airway & Oxygen Therapy: Patient connected to face mask oxygen  Post-op Assessment: Post -op Vital signs reviewed and stable  Post vital signs: stable  Last Vitals:  Vitals:   09/04/17 0640  BP: 130/84  Pulse: 82  Resp: 16  Temp: 36.8 C  SpO2: 96%    Last Pain:  Vitals:   09/04/17 0640  TempSrc: Oral      Patients Stated Pain Goal: 3 (08/18/30 3557)  Complications: No apparent anesthesia complications

## 2017-09-04 NOTE — Op Note (Signed)
DATE OF PROCEDURE:  09/04/2017                              OPERATIVE REPORT  SURGEON:  Leta Baptist, MD  PREOPERATIVE DIAGNOSES: 1. Hurthle cell carcinoma  POSTOPERATIVE DIAGNOSES: 1. Hurthle cell carcinoma  PROCEDURE PERFORMED:  Completion thyroidectomy (CPT [phone])  ANESTHESIA:  General endotracheal tube anesthesia.  COMPLICATIONS:  None.  ESTIMATED BLOOD LOSS:  100 mL  INDICATION FOR PROCEDURE:  Carlos Payne is a 57 y.o. male with a history of a large 6 cm left thyroid mass. He underwent left hemithyroidectomy surgery one week ago. The pathology was consistent with Hurthle cell carcinoma. Based on the above findings, the decision was made for the patient to undergo the completion thyroidectomy procedure. The risks, benefits, alternatives, and details of the procedure were discussed with the patient.  Questions were invited and answered.  Informed consent was obtained.  DESCRIPTION:  The patient was taken to the operating room and placed supine on the operating table.  General endotracheal tube anesthesia was administered by the anesthesiologist.  The patient was positioned and prepped and draped in a standard fashion for thyroidectomy surgery.  1% lidocaine with 1-100,000 epinephrine was infiltrated at the planned site of incision. A lower neck transverse incision was made. The incision was carried down to the level of the platysma muscles. Subplatysmal flaps were elevated in the standard fashion. The strap muscles were divided and retracted.  The right thyroid lobe was exposed. The right thyroid lobe was carefully dissected free from the surrounding soft tissue. The right recurrent laryngeal nerve was identified and preserved. The nerve was noted to be functional throughout the case. The right thyroid lobe was sent to the pathology department for permanent histologic identification. No other mass or lesion was noted. The surgical site was copiously irrigated. The strap muscles were  reapproximated using 4-0 Vicryl sutures. The incision was closed in layers with 4-0 Vicryl and Dermabond. A #10 JP drain was placed.   The care of the patient was turned over to the anesthesiologist.  The patient was awakened from anesthesia without difficulty.  The patient was extubated and transferred to the recovery room in good condition.  OPERATIVE FINDINGS:  The remaining right thyroid lobe was removed without difficulty.  SPECIMEN:  Right thyroid lobe.  FOLLOWUP CARE:  The patient will be observed overnight. His calcium level will be monitored.   Carlos Payne W Carlos Payne 09/04/2017 9:54 AM

## 2017-09-04 NOTE — Interval H&P Note (Signed)
History and Physical Interval Note:  09/04/2017 7:36 AM  Carlos Payne  has presented today for surgery, with the diagnosis of thyroid cancer  The various methods of treatment have been discussed with the patient and family. After consideration of risks, benefits and other options for treatment, the patient has consented to  Procedure(s): COMPLETION THYROIDECTOMY (N/A) as a surgical intervention .  The patient's history has been reviewed, patient examined, no change in status, stable for surgery.  I have reviewed the patient's chart and labs.  Questions were answered to the patient's satisfaction.     Carlos Payne

## 2017-09-04 NOTE — Anesthesia Postprocedure Evaluation (Signed)
Anesthesia Post Note  Patient: Carlos Payne  Procedure(s) Performed: COMPLETION THYROIDECTOMY (N/A Neck)     Patient location during evaluation: PACU Anesthesia Type: General Level of consciousness: awake and alert Pain management: pain level controlled Vital Signs Assessment: post-procedure vital signs reviewed and stable Respiratory status: spontaneous breathing, nonlabored ventilation, respiratory function stable and patient connected to nasal cannula oxygen Cardiovascular status: blood pressure returned to baseline and stable Postop Assessment: no apparent nausea or vomiting Anesthetic complications: no    Last Vitals:  Vitals:   09/04/17 1120 09/04/17 1846  BP: (!) 137/96 108/79  Pulse: 84 98  Resp: 16 17  Temp: 36.7 C 37 C  SpO2: 94% 95%    Last Pain:  Vitals:   09/04/17 1846  TempSrc: Oral  PainSc:                  Ryan P Ellender

## 2017-09-04 NOTE — Progress Notes (Signed)
Called up to 6N - scd machine available in room -will place on pt on arrival to unit

## 2017-09-04 NOTE — Anesthesia Procedure Notes (Addendum)
Procedure Name: Intubation Date/Time: 09/04/2017 7:55 AM Performed by: Lavell Luster, CRNA Pre-anesthesia Checklist: Patient identified, Emergency Drugs available, Suction available, Patient being monitored and Timeout performed Patient Re-evaluated:Patient Re-evaluated prior to induction Oxygen Delivery Method: Circle system utilized Preoxygenation: Pre-oxygenation with 100% oxygen Induction Type: IV induction Ventilation: Mask ventilation without difficulty Laryngoscope Size: Miller and 2 Grade View: Grade III Tube type: Oral Tube size: 8.0 mm Number of attempts: 1 Airway Equipment and Method: Stylet,  Bougie stylet and Bite block Placement Confirmation: ETT inserted through vocal cords under direct vision,  positive ETCO2 and breath sounds checked- equal and bilateral Secured at: 24 cm Tube secured with: Tape Dental Injury: Teeth and Oropharynx as per pre-operative assessment  Difficulty Due To: Difficulty was anticipated, Difficult Airway- due to anterior larynx, Difficult Airway- due to limited oral opening and Difficult Airway- due to reduced neck mobility Comments: Attempted to pass NIMS ETT after DL with glidescope, good view of cords however tube would not pass.  Dr Roanna Banning attempted with glidescope x1 and ETT would not pass.  Ellender DL with Sabra Heck 2, ETT passed easily with bougie stylet.  Difficulty was anticipated.  Henderson Cloud, CRNA

## 2017-09-05 ENCOUNTER — Encounter (HOSPITAL_COMMUNITY): Payer: Self-pay | Admitting: Otolaryngology

## 2017-09-05 DIAGNOSIS — I10 Essential (primary) hypertension: Secondary | ICD-10-CM | POA: Diagnosis not present

## 2017-09-05 DIAGNOSIS — E119 Type 2 diabetes mellitus without complications: Secondary | ICD-10-CM | POA: Diagnosis not present

## 2017-09-05 DIAGNOSIS — R0789 Other chest pain: Secondary | ICD-10-CM | POA: Diagnosis not present

## 2017-09-05 DIAGNOSIS — Z79899 Other long term (current) drug therapy: Secondary | ICD-10-CM | POA: Diagnosis not present

## 2017-09-05 DIAGNOSIS — E890 Postprocedural hypothyroidism: Secondary | ICD-10-CM | POA: Diagnosis not present

## 2017-09-05 DIAGNOSIS — C73 Malignant neoplasm of thyroid gland: Secondary | ICD-10-CM | POA: Diagnosis not present

## 2017-09-05 LAB — CALCIUM
CALCIUM: 8.3 mg/dL — AB (ref 8.9–10.3)
Calcium: 8.3 mg/dL — ABNORMAL LOW (ref 8.9–10.3)

## 2017-09-05 MED ORDER — CALCIUM CARBONATE-VITAMIN D 500-200 MG-UNIT PO TABS: 2.0000 | ORAL_TABLET | Freq: Two times a day (BID) | ORAL | 12 refills | Status: DC

## 2017-09-05 MED ORDER — OXYCODONE-ACETAMINOPHEN 5-325 MG PO TABS: 1.0000 | ORAL_TABLET | ORAL | 0 refills | Status: DC | PRN

## 2017-09-05 MED ORDER — AMOXICILLIN 875 MG PO TABS: 875.0000 mg | ORAL_TABLET | Freq: Two times a day (BID) | ORAL | 0 refills | Status: AC

## 2017-09-05 MED ORDER — LEVOTHYROXINE SODIUM 100 MCG PO TABS: 100.0000 ug | ORAL_TABLET | Freq: Every day | ORAL | 10 refills | Status: DC

## 2017-09-05 NOTE — Progress Notes (Signed)
Subjective: No issues overnight. Tolerated po.  Objective: Vital signs in last 24 hours: Temp:  [97.8 F (36.6 C)-98.6 F (37 C)] 98.2 F (36.8 C) (01/27 0900) Pulse Rate:  [82-98] 91 (01/27 0900) Resp:  [16-22] 16 (01/27 0900) BP: (108-137)/(73-96) 137/82 (01/27 0900) SpO2:  [94 %-97 %] 97 % (01/27 0900) Weight:  [115.2 kg (254 lb)] 115.2 kg (254 lb) (01/26 1120)  Incision c/d/i. Voice is strong.  No results for input(s): WBC, HGB, HCT, PLT in the last 72 hours. Recent Labs    09/04/17 1955 09/05/17 0745  CALCIUM 8.6* 8.3*    Medications:  I have reviewed the patient's current medications. Scheduled: . calcium-vitamin D  2 tablet Oral BID  . docusate sodium  50 mg Oral BID  . lisinopril  20 mg Oral Daily   And  . hydrochlorothiazide  25 mg Oral Daily   Continuous: . dextrose 5 % and 0.45 % NaCl with KCl 20 mEq/L 100 mL/hr at 09/05/17 0730    Assessment/Plan: POD #1 s/p completion thyroidectomy. Will continue to monitor Ca level. Will d/c home once Ca level is stable.   LOS: 0 days   Carlos Payne W Carlos Payne 09/05/2017, 10:16 AM

## 2017-09-05 NOTE — Discharge Summary (Signed)
Physician Discharge Summary  Patient ID: Carlos Payne MRN: [id number] DOB/AGE: Sep 23, 1960 57 y.o.  Admit date: 09/04/2017 Discharge date: 09/05/2017  Admission Diagnoses: Thyroid Carcinoma  Discharge Diagnoses: Thyroid Carcinoma Active Problems:   Thyroid cancer [facility])   Discharged Condition: good  Hospital Course: Pt had an uneventful overnight stay. Pt tolerated po well. No bleeding. No stridor. Voice is strong.  Consults: None  Significant Diagnostic Studies: None  Treatments: surgery: Completion thyroidectomy  Discharge Exam: Blood pressure (!) 136/95, pulse 77, temperature 97.7 F (36.5 C), temperature source Oral, resp. rate 17, height 5\' 8"  (1.727 m), weight 115.2 kg (254 lb), SpO2 98 %. Incision/Wound:c/d/i Voice is strong.  Disposition: 01-Home or Self Care  Discharge Instructions    Activity as tolerated - No restrictions   Complete by:  As directed    Diet general   Complete by:  As directed      Allergies as of 09/05/2017   No Known Allergies     Medication List    STOP taking these medications   TUMS ULTRA 1000 400 MG chewable tablet Generic drug:  calcium elemental as carbonate     TAKE these medications   acetaminophen 500 MG tablet Commonly known as:  TYLENOL Take 1,000 mg by mouth every 8 (eight) hours as needed for moderate pain.   amoxicillin 875 MG tablet Commonly known as:  AMOXIL Take 1 tablet (875 mg total) by mouth 2 (two) times daily for 5 days.   calcium-vitamin D 500-200 MG-UNIT tablet Commonly known as:  OSCAL WITH D Take 2 tablets by mouth 2 (two) times daily.   docusate sodium 50 MG capsule Commonly known as:  COLACE Take 50 mg by mouth 2 (two) times daily.   levothyroxine 100 MCG tablet Commonly known as:  SYNTHROID Take 1 tablet (100 mcg total) by mouth daily before breakfast.   lisinopril-hydrochlorothiazide 20-25 MG tablet Commonly known as:  PRINZIDE,ZESTORETIC Take 1 tablet by mouth daily.    oxyCODONE-acetaminophen 5-325 MG tablet Commonly known as:  PERCOCET/ROXICET Take 1-2 tablets by mouth every 4 (four) hours as needed for severe pain. What changed:  how much to take      Follow-up Information    Leta Baptist, MD Follow up in 1 week(s).   Specialty:  Otolaryngology Why:  As scheduled Contact information: [address] [place] [address] [phone]           Signed: Burley Saver 09/05/2017, 5:29 PM

## 2017-09-09 ENCOUNTER — Ambulatory Visit (INDEPENDENT_AMBULATORY_CARE_PROVIDER_SITE_OTHER): Payer: BLUE CROSS/BLUE SHIELD | Admitting: Otolaryngology

## 2017-09-10 DIAGNOSIS — R946 Abnormal results of thyroid function studies: Secondary | ICD-10-CM | POA: Diagnosis not present

## 2017-09-10 DIAGNOSIS — E58 Dietary calcium deficiency: Secondary | ICD-10-CM | POA: Diagnosis not present

## 2017-10-08 DIAGNOSIS — R946 Abnormal results of thyroid function studies: Secondary | ICD-10-CM | POA: Diagnosis not present

## 2017-10-08 LAB — TSH: TSH: 7.37 — AB (ref 0.41–5.90)

## 2017-10-12 ENCOUNTER — Ambulatory Visit (INDEPENDENT_AMBULATORY_CARE_PROVIDER_SITE_OTHER): Payer: BLUE CROSS/BLUE SHIELD | Admitting: "Endocrinology

## 2017-10-12 ENCOUNTER — Encounter: Payer: Self-pay | Admitting: "Endocrinology

## 2017-10-12 VITALS — BP 135/88 | HR 76 | Ht 68.0 in | Wt 262.0 lb

## 2017-10-12 DIAGNOSIS — E890 Postprocedural hypothyroidism: Secondary | ICD-10-CM

## 2017-10-12 DIAGNOSIS — C73 Malignant neoplasm of thyroid gland: Secondary | ICD-10-CM | POA: Diagnosis not present

## 2017-10-12 MED ORDER — LEVOTHYROXINE SODIUM 175 MCG PO TABS: 175.0000 ug | ORAL_TABLET | Freq: Every day | ORAL | 3 refills | Status: DC

## 2017-10-12 NOTE — Progress Notes (Signed)
Consult Note                                            10/13/2017, 11:11 AM   Subjective:    Patient ID: Carlos Payne, male    DOB: October 21, 1960, PCP Practice[facility]   Past Medical History:  Diagnosis Date  . Cancer [facility])    thyroid cancer  . Dyspnea   . GERD (gastroesophageal reflux disease)   . Hypertension   . Needle phobia    severe, needs to lie down for sticks  . Seizures ([place])    as a child  . Thyroid mass    left  . Wears hearing aid in both ears    Past Surgical History:  Procedure Laterality Date  . EYE SURGERY     foreign object removed  . FINGER SURGERY Right    index  . HERNIA REPAIR     age 59  . MULTIPLE TOOTH EXTRACTIONS    . THYROIDECTOMY Left 08/24/2017   Procedure: LEFT HEMI THYROIDECTOMY;  Surgeon: Leta Baptist, MD;  Location: [facility];  Service: ENT;  Laterality: Left;  . THYROIDECTOMY N/A 09/04/2017   Procedure: COMPLETION THYROIDECTOMY;  Surgeon: Leta Baptist, MD;  Location: [facility] OR;  Service: ENT;  Laterality: N/A;   Social History   Socioeconomic History  . Marital status: Married    Spouse name: None  . Number of children: None  . Years of education: None  . Highest education level: None  Social Needs  . Financial resource strain: None  . Food insecurity - worry: None  . Food insecurity - inability: None  . Transportation needs - medical: None  . Transportation needs - non-medical: None  Occupational History  . None  Tobacco Use  . Smoking status: Never Smoker  . Smokeless tobacco: Never Used  Substance and Sexual Activity  . Alcohol use: No    Frequency: Never  . Drug use: No  . Sexual activity: None  Other Topics Concern  . None  Social History Narrative  . None   Outpatient Encounter Medications as of 10/12/2017  Medication Sig  . calcium-vitamin D (OSCAL WITH D) 500-200 MG-UNIT tablet Take 2 tablets by mouth 2 (two) times daily. (Patient taking differently: Take 1 tablet by mouth 2 (two) times  daily. )  . levothyroxine (SYNTHROID, LEVOTHROID) 175 MCG tablet Take 1 tablet (175 mcg total) by mouth daily before breakfast.  . lisinopril-hydrochlorothiazide (PRINZIDE,ZESTORETIC) 20-25 MG tablet Take 1 tablet by mouth daily.  . [DISCONTINUED] levothyroxine (SYNTHROID, LEVOTHROID) 112 MCG tablet Take 112 mcg by mouth daily before breakfast.  . acetaminophen (TYLENOL) 500 MG tablet Take 1,000 mg by mouth every 8 (eight) hours as needed for moderate pain.  Marland Kitchen docusate sodium (COLACE) 50 MG capsule Take 50 mg by mouth 2 (two) times daily.  Marland Kitchen oxyCODONE-acetaminophen (PERCOCET/ROXICET) 5-325 MG tablet Take 1-2 tablets by mouth every 4 (four) hours as needed for severe pain.  . [DISCONTINUED] levothyroxine (SYNTHROID) 100 MCG tablet Take 1 tablet (100 mcg total) by mouth daily before breakfast.   No facility-administered encounter medications on file as of 10/12/2017.    ALLERGIES: No Known Allergies  VACCINATION STATUS:  There is no immunization history on file for this patient.  HPI Carlos Payne is 57 y.o. male who presents today with a medical history as above. he  is being seen in consultation for thyroid cancer requested by Dr. Benjamine Mola, his ENT surgeon.  His primary care is at [facility].  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  Completion surgery on September 04, 2017 with right lobe thyroidectomy showed benign thyroid parenchyma, benign parathyroid gland, with no evidence of malignancy. -Patient was subsequently put on levothyroxine currently at 112 mcg p.o. every morning, as well as Os-Cal, 500/200 p.o. twice daily. -Patient did not receive any further adjunctive therapy for thyroid cancer. -He denies prior history of dysfunction  Thyroid , parathyroid, pituitary, adrenal dysfunction personally or in his family. -He denies exposure to neck  radiation. -He reports 10 pounds of weight gain since his surgery, currently complaining of cold intolerance, fatigue. -Recent labs from outside facility show elevated TSH of 7.37, along with low normal free T4 of 0.93. -He denies dysphagia, odynophagia, change in his voice.  Review of Systems  Constitutional: + weight gain, + fatigue, + subjective hypothermia Eyes: no blurry vision, no xerophthalmia ENT: no sore throat, no nodules palpated in throat, no dysphagia/odynophagia, no hoarseness Cardiovascular: no Chest Pain, no Shortness of Breath, no palpitations, no leg swelling Respiratory: no cough, no SOB Gastrointestinal: no Nausea/Vomiting/Diarhhea Musculoskeletal: no muscle/joint aches Skin: no rashes Neurological: no tremors, no numbness, no tingling, no dizziness Psychiatric: no depression, no anxiety  Objective:    BP 135/88   Pulse 76   Ht 5\' 8"  (1.727 m)   Wt 262 lb (118.8 kg)   BMI 39.84 kg/m   Wt Readings from Last 3 Encounters:  10/12/17 262 lb (118.8 kg)  09/04/17 254 lb (115.2 kg)  08/24/17 254 lb (115.2 kg)    Physical Exam  Constitutional: + Obese, not in acute distress, normal state of mind, + is significantly hard of hearing. Eyes: PERRLA, EOMI, no exophthalmos ENT: moist mucous membranes, + healing post thyroidectomy scar on anterior lower neck, no cervical lymphadenopathy Cardiovascular: normal precordial activity, Regular Rate and Rhythm, no Murmur/Rubs/Gallops Respiratory:  adequate breathing efforts, no gross chest deformity, Clear to auscultation bilaterally Gastrointestinal: abdomen soft, Non -tender, No distension, Bowel Sounds present Musculoskeletal: no gross deformities, strength intact in all four extremities Skin: moist, warm, no rashes Neurological: no tremor with outstretched hands, Deep tendon reflexes normal in all four extremities.  CMP ( most recent) CMP     Component Value Date/Time   NA 136 08/19/2017 0921   K 4.5 08/19/2017 0921    CL 100 (L) 08/19/2017 0921   CO2 27 08/19/2017 0921   GLUCOSE 102 (H) 08/19/2017 0921   BUN 23 (H) 08/19/2017 0921   CREATININE 1.24 08/19/2017 0921   CALCIUM 8.3 (L) 09/05/2017 1521   GFRNONAA >60 08/19/2017 0921   GFRAA >60 08/19/2017 8841    Lab Results  Component Value Date   TSH 7.37 (A) 10/08/2017    October 08, 2017 lab work from outside facility showed calcium 9.3, TSH elevated at 7.3 7, free T4 low normal at 0.93     Review of his surgical pathology from August 24, 2017 showed 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  Tumor capsule was focally invaded by tumor, no extrathyroidal extension, margin free of tumor, lymphatic/vascular invasion present, no lymph nodes were examined.  TNM code p T3a, pNX .  Assessment &  Plan:   1. Thyroid cancer ([place]) 2. Postsurgical hypothyroidism  - I have reviewed his available thyroid records and clinically evaluated the patient. -He has a stage 3 Hurthle cell carcinoma status post total thyroidectomy on 2 stages.  Left hemithyroidectomy on August 24, 2017 and completion right hemithyroidectomy on September 04, 2017 with no evidence of malignancy on the right side. -Hurthle cell carcinoma is a variant of follicular thyroid carcinoma, known to behave in a more aggressive manner compared to papillary thyroid is normal, which makes it important for him to get adjuvant therapy.  I discussed thyroid remnant ablation preceded by Thyrogen stimulation and he agrees with plan.  -This treatment will be scheduled to be administered as soon as possible followed by whole-body scan.   - NM RAI Thyroid Ca W/ Thyrogen - NM Whole Body I131 Scan S/P Ca Rx  -On the day of whole-body scan he will have a blood draw for:  - Thyroglobulin Level - Thyroglobulin antibody  -Risk of tumor recurrence in the next 5 - 10 years, and the need for consistent follow-up with periodic imaging studies  is emphasized to the patient and his wife in exam room.  They voiced understanding.   -For his postsurgical hypothyroidism, he will need higher dose of Synthroid.  Target TSH for him would be between 0.1-0.5. -I discussed and increased his Synthroid to 175 mcg p.o. every morning.  - We discussed about correct intake of levothyroxine, at fasting, with water, separated by at least 30 minutes from breakfast, and separated by more than 4 hours from calcium, iron, multivitamins, acid reflux medications (PPIs). -Patient is made aware of the fact that thyroid hormone replacement is needed for life, dose to be adjusted by periodic monitoring of thyroid function tests.  -He will have repeat free T4, TSH, free T3 with before his next visit.  -He is advised to continue his current dose of Os-Cal 500/200 p.o. twice daily.  He will have repeat CMP to monitor calcium.   - Time spent with the patient: 45 minutes, of which >50% was spent in obtaining information about his symptoms, reviewing his previous labs, evaluations, and treatments, counseling him about his stage 3 thyroid cancer, and developing a plan for long term treatment; he had a number of questions which I addressed.  - I advised patient to maintain close follow up with [facility] for primary care needs. Follow up plan: Return in about 6 weeks (around 11/23/2017) for follow up with Whole Body Scan w/Thyrogen, follow up with pre-visit labs.   Glade Lloyd, MD [facility] [address] [place], [place] [address] Phone: [phone]  Fax: [phone]     10/13/2017, 11:11 AM  This note was partially dictated with voice recognition software. Similar sounding words can be transcribed inadequately or may not  be corrected upon review.

## 2017-10-14 ENCOUNTER — Telehealth: Payer: Self-pay

## 2017-10-14 NOTE — Telephone Encounter (Signed)
Pt notified of NM appts at radiology. All appts given to pts wife and instructions given. Pt was given the # to NM at [facility] because she wasn't sure about days and times.

## 2017-10-16 DIAGNOSIS — C73 Malignant neoplasm of thyroid gland: Secondary | ICD-10-CM | POA: Diagnosis not present

## 2017-10-16 DIAGNOSIS — A048 Other specified bacterial intestinal infections: Secondary | ICD-10-CM | POA: Diagnosis not present

## 2017-10-16 DIAGNOSIS — I5032 Chronic diastolic (congestive) heart failure: Secondary | ICD-10-CM | POA: Diagnosis not present

## 2017-10-16 DIAGNOSIS — H6121 Impacted cerumen, right ear: Secondary | ICD-10-CM | POA: Diagnosis not present

## 2017-10-16 DIAGNOSIS — I10 Essential (primary) hypertension: Secondary | ICD-10-CM | POA: Diagnosis not present

## 2017-10-18 ENCOUNTER — Telehealth: Payer: Self-pay

## 2017-10-18 NOTE — Telephone Encounter (Signed)
The couple needs to come in for more counseling.

## 2017-10-18 NOTE — Telephone Encounter (Signed)
Pts wife is requesting a call from Dr Dorris Fetch. Pt is very confused about the Thyroid ablation and WBS that he is scheduled for. [facility] NM staff tried to explain procedure but they are still very confused.

## 2017-10-20 ENCOUNTER — Encounter (HOSPITAL_COMMUNITY)
Admission: RE | Admit: 2017-10-20 | Discharge: 2017-10-20 | Disposition: A | Discharge status: Home / Self Care | Payer: BLUE CROSS/BLUE SHIELD | Source: Ambulatory Visit | Attending: "Endocrinology | Admitting: "Endocrinology

## 2017-10-20 ENCOUNTER — Encounter (HOSPITAL_COMMUNITY): Payer: Self-pay

## 2017-10-20 DIAGNOSIS — C73 Malignant neoplasm of thyroid gland: Secondary | ICD-10-CM | POA: Diagnosis not present

## 2017-10-20 MED ORDER — THYROTROPIN ALFA 1.1 MG IM SOLR
0.9000 mg | INTRAMUSCULAR | Status: AC
  Administered 2017-10-20: 0.9 mg via INTRAMUSCULAR

## 2017-10-20 MED ORDER — THYROTROPIN ALFA 1.1 MG IM SOLR
INTRAMUSCULAR | Status: AC
  Administered 2017-10-20: 0.9 mg via INTRAMUSCULAR
  Filled 2017-10-20: qty 0.9

## 2017-10-20 MED ORDER — STERILE WATER FOR INJECTION IJ SOLN
INTRAMUSCULAR | Status: AC
  Administered 2017-10-20: 1.1 mL
  Filled 2017-10-20: qty 10

## 2017-10-21 ENCOUNTER — Encounter (HOSPITAL_COMMUNITY)
Admission: RE | Admit: 2017-10-21 | Discharge: 2017-10-21 | Disposition: A | Discharge status: Home / Self Care | Payer: BLUE CROSS/BLUE SHIELD | Source: Ambulatory Visit | Attending: "Endocrinology | Admitting: "Endocrinology

## 2017-10-21 DIAGNOSIS — C73 Malignant neoplasm of thyroid gland: Secondary | ICD-10-CM

## 2017-10-21 MED ORDER — THYROTROPIN ALFA 1.1 MG IM SOLR
0.9000 mg | INTRAMUSCULAR | Status: AC
  Administered 2017-10-21: 0.9 mg via INTRAMUSCULAR

## 2017-10-21 MED ORDER — STERILE WATER FOR INJECTION IJ SOLN
INTRAMUSCULAR | Status: AC
  Administered 2017-10-21: 1 mL
  Filled 2017-10-21: qty 10

## 2017-10-21 MED ORDER — THYROTROPIN ALFA 1.1 MG IM SOLR
INTRAMUSCULAR | Status: AC
  Administered 2017-10-21: 0.9 mg via INTRAMUSCULAR
  Filled 2017-10-21: qty 0.9

## 2017-10-22 ENCOUNTER — Encounter (HOSPITAL_COMMUNITY)
Admission: RE | Admit: 2017-10-22 | Discharge: 2017-10-22 | Disposition: A | Discharge status: Home / Self Care | Payer: BLUE CROSS/BLUE SHIELD | Source: Ambulatory Visit | Attending: "Endocrinology | Admitting: "Endocrinology

## 2017-10-22 DIAGNOSIS — C73 Malignant neoplasm of thyroid gland: Secondary | ICD-10-CM | POA: Diagnosis not present

## 2017-10-22 MED ORDER — SODIUM IODIDE I 131 CAPSULE
150.0000 | Freq: Once | INTRAVENOUS | Status: AC | PRN
  Administered 2017-10-22: 153 via ORAL

## 2017-11-01 ENCOUNTER — Encounter (HOSPITAL_COMMUNITY)
Admission: RE | Admit: 2017-11-01 | Discharge: 2017-11-01 | Disposition: A | Discharge status: Home / Self Care | Payer: BLUE CROSS/BLUE SHIELD | Source: Ambulatory Visit | Attending: "Endocrinology | Admitting: "Endocrinology

## 2017-11-01 ENCOUNTER — Other Ambulatory Visit (HOSPITAL_COMMUNITY)
Admission: RE | Admit: 2017-11-01 | Discharge: 2017-11-01 | Disposition: A | Discharge status: Home / Self Care | Payer: BLUE CROSS/BLUE SHIELD | Source: Ambulatory Visit | Attending: "Endocrinology | Admitting: "Endocrinology

## 2017-11-01 ENCOUNTER — Other Ambulatory Visit (HOSPITAL_COMMUNITY)
Admission: RE | Admit: 2017-11-01 | Discharge: 2017-11-01 | Disposition: A | Discharge status: Home / Self Care | Payer: BLUE CROSS/BLUE SHIELD | Source: Ambulatory Visit | Attending: Otolaryngology | Admitting: Otolaryngology

## 2017-11-01 ENCOUNTER — Encounter (HOSPITAL_COMMUNITY): Payer: Self-pay

## 2017-11-01 DIAGNOSIS — C73 Malignant neoplasm of thyroid gland: Secondary | ICD-10-CM | POA: Diagnosis not present

## 2017-11-01 DIAGNOSIS — D440 Neoplasm of uncertain behavior of thyroid gland: Secondary | ICD-10-CM | POA: Insufficient documentation

## 2017-11-01 DIAGNOSIS — E890 Postprocedural hypothyroidism: Secondary | ICD-10-CM | POA: Insufficient documentation

## 2017-11-01 LAB — COMPREHENSIVE METABOLIC PANEL
ALT: 45 U/L (ref 17–63)
ANION GAP: 11 (ref 5–15)
AST: 37 U/L (ref 15–41)
Albumin: 4.5 g/dL (ref 3.5–5.0)
Alkaline Phosphatase: 60 U/L (ref 38–126)
BILIRUBIN TOTAL: 1.2 mg/dL (ref 0.3–1.2)
BUN: 21 mg/dL — ABNORMAL HIGH (ref 6–20)
CHLORIDE: 102 mmol/L (ref 101–111)
CO2: 28 mmol/L (ref 22–32)
Calcium: 9.5 mg/dL (ref 8.9–10.3)
Creatinine, Ser: 1.05 mg/dL (ref 0.61–1.24)
Glucose, Bld: 108 mg/dL — ABNORMAL HIGH (ref 65–99)
POTASSIUM: 4.3 mmol/L (ref 3.5–5.1)
Sodium: 141 mmol/L (ref 135–145)
TOTAL PROTEIN: 8 g/dL (ref 6.5–8.1)

## 2017-11-01 LAB — TSH: TSH: 1.309 u[IU]/mL (ref 0.350–4.500)

## 2017-11-01 LAB — T4, FREE: FREE T4: 1.08 ng/dL (ref 0.61–1.12)

## 2017-11-02 LAB — T3, FREE: T3 FREE: 4.2 pg/mL (ref 2.0–4.4)

## 2017-11-02 LAB — THYROGLOBULIN ANTIBODY: Thyroglobulin Antibody: <1 IU/mL (ref 0.0–0.9)

## 2017-11-04 LAB — COMPREHENSIVE THYROGLOBULIN
Anti-Thyroglobulin Antibodies: <1 IU/mL
Thyroglobulin (ICMA): 0.3 ng/mL

## 2017-11-08 ENCOUNTER — Ambulatory Visit (INDEPENDENT_AMBULATORY_CARE_PROVIDER_SITE_OTHER): Payer: BLUE CROSS/BLUE SHIELD | Admitting: Otolaryngology

## 2017-11-10 DIAGNOSIS — R7989 Other specified abnormal findings of blood chemistry: Secondary | ICD-10-CM | POA: Diagnosis not present

## 2017-11-10 DIAGNOSIS — B9681 Helicobacter pylori [H. pylori] as the cause of diseases classified elsewhere: Secondary | ICD-10-CM | POA: Diagnosis not present

## 2017-11-25 ENCOUNTER — Ambulatory Visit (INDEPENDENT_AMBULATORY_CARE_PROVIDER_SITE_OTHER): Payer: BLUE CROSS/BLUE SHIELD | Admitting: "Endocrinology

## 2017-11-25 ENCOUNTER — Encounter: Payer: Self-pay | Admitting: "Endocrinology

## 2017-11-25 VITALS — BP 121/85 | HR 77 | Ht 68.0 in | Wt 265.0 lb

## 2017-11-25 DIAGNOSIS — C73 Malignant neoplasm of thyroid gland: Secondary | ICD-10-CM | POA: Diagnosis not present

## 2017-11-25 DIAGNOSIS — E890 Postprocedural hypothyroidism: Secondary | ICD-10-CM | POA: Diagnosis not present

## 2017-11-25 NOTE — Progress Notes (Signed)
Endocrinology follow-up note                                            11/25/2017, 7:02 PM   Subjective:    Patient ID: Carlos Payne, male    DOB: 01-19-1961, PCP Practice, [facility]   Past Medical History:  Diagnosis Date  . Cancer [facility])    thyroid cancer  . Dyspnea   . GERD (gastroesophageal reflux disease)   . Hypertension   . Needle phobia    severe, needs to lie down for sticks  . Seizures ([place])    as a child  . Thyroid mass    left  . Wears hearing aid in both ears    Past Surgical History:  Procedure Laterality Date  . EYE SURGERY     foreign object removed  . FINGER SURGERY Right    index  . HERNIA REPAIR     age 50  . MULTIPLE TOOTH EXTRACTIONS    . THYROIDECTOMY Left 08/24/2017   Procedure: LEFT HEMI THYROIDECTOMY;  Surgeon: Leta Baptist, MD;  Location: [place];  Service: ENT;  Laterality: Left;  . THYROIDECTOMY N/A 09/04/2017   Procedure: COMPLETION THYROIDECTOMY;  Surgeon: Leta Baptist, MD;  Location: [facility];  Service: ENT;  Laterality: N/A;   Social History   Socioeconomic History  . Marital status: Married    Spouse name: Not on file  . Number of children: Not on file  . Years of education: Not on file  . Highest education level: Not on file  Occupational History  . Not on file  Social Needs  . Financial resource strain: Not on file  . Food insecurity:    Worry: Not on file    Inability: Not on file  . Transportation needs:    Medical: Not on file    Non-medical: Not on file  Tobacco Use  . Smoking status: Never Smoker  . Smokeless tobacco: Never Used  Substance and Sexual Activity  . Alcohol use: No    Frequency: Never  . Drug use: No  . Sexual activity: Not on file  Lifestyle  . Physical activity:    Days per week: Not on file    Minutes per session: Not on file  . Stress: Not on file  Relationships  . Social connections:    Talks on phone: Not on file    Gets together: Not on file    Attends religious  service: Not on file    Active member of club or organization: Not on file    Attends meetings of clubs or organizations: Not on file    Relationship status: Not on file  Other Topics Concern  . Not on file  Social History Narrative  . Not on file   Outpatient Encounter Medications as of 11/25/2017  Medication Sig  . acetaminophen (TYLENOL) 500 MG tablet Take 1,000 mg by mouth every 8 (eight) hours as needed for moderate pain.  . calcium-vitamin D (OSCAL WITH D) 500-200 MG-UNIT tablet Take 2 tablets by mouth 2 (two) times daily. (Patient taking differently: Take 1 tablet by mouth 2 (two) times daily. )  . docusate sodium (COLACE) 50 MG capsule Take 50 mg by mouth 2 (two) times daily.  Marland Kitchen levothyroxine (SYNTHROID, LEVOTHROID) 175 MCG tablet Take 1 tablet (175 mcg total) by mouth daily before breakfast.  .  lisinopril-hydrochlorothiazide (PRINZIDE,ZESTORETIC) 20-25 MG tablet Take 1 tablet by mouth daily.  Marland Kitchen oxyCODONE-acetaminophen (PERCOCET/ROXICET) 5-325 MG tablet Take 1-2 tablets by mouth every 4 (four) hours as needed for severe pain.   No facility-administered encounter medications on file as of 11/25/2017.    ALLERGIES: No Known Allergies  VACCINATION STATUS:  There is no immunization history on file for this patient.  HPI Carlos Payne is 57 y.o. male who presents today with a medical history as above. he is being seen in follow-up for thyroid cancer requested by Dr. Benjamine Mola, his ENT surgeon.  His primary care is at [facility], [facility].  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  Completion surgery on September 04, 2017 with right lobe thyroidectomy showed benign thyroid parenchyma, benign parathyroid gland, with no evidence of malignancy.  -After his last visit with [place] here, he was scheduled to receive Thyrogen stimulated thyroid remnant ablation with  I-131 which was delivered on October 22, 2017 followed by whole body scan on November 01, 2017.  His whole body scan was negative for distant metastasis. -His levothyroxine was adjusted at 175 mcg p.o. every morning.  He reports compliance.  He is also on  Os-Cal, 500/200 p.o. twice daily.  -He denies prior history of dysfunction  Thyroid , parathyroid, pituitary, adrenal dysfunction personally or in his family. -He denies exposure to neck radiation. -He reports slower weight gain of 3 pounds since his last visit compared to 10 pounds of weight gain prior to his last visit.   -He denies new complaints.  -He denies dysphagia, odynophagia, change in his voice.  Review of Systems  Constitutional: + weight gain, + fatigue, + subjective hypothermia Eyes: no blurry vision, no xerophthalmia ENT: no sore throat, no nodules palpated in throat, no dysphagia/odynophagia, no hoarseness Cardiovascular: no chest pain, no shortness of breath, no palpitations.   Respiratory: no cough, no SOB Gastrointestinal: no Nausea/Vomiting/Diarhhea Musculoskeletal: no muscle/joint aches Skin: no rashes Neurological: no tremors, no numbness, no tingling, no dizziness Psychiatric: no depression, no anxiety  Objective:    BP 121/85   Pulse 77   Ht 5\' 8"  (1.727 m)   Wt 265 lb (120.2 kg)   BMI 40.29 kg/m   Wt Readings from Last 3 Encounters:  11/25/17 265 lb (120.2 kg)  10/12/17 262 lb (118.8 kg)  09/04/17 254 lb (115.2 kg)    Physical Exam  Constitutional: + Obese, not in acute distress, alert and oriented x3. + is significantly hard of hearing. Eyes: PERRLA, EOMI, no exophthalmos ENT: moist mucous membranes, + well-healed post thyroidectomy scar on anterior lower neck,  no cervical lymphadenopathy Musculoskeletal: no gross deformities, strength intact in all four extremities Skin: moist, warm, no rashes Neurological: no tremor with outstretched hands, Deep tendon reflexes normal in all four  extremities.  CMP ( most recent) CMP     Component Value Date/Time   NA 141 11/01/2017 0836   K 4.3 11/01/2017 0836   CL 102 11/01/2017 0836   CO2 28 11/01/2017 0836   GLUCOSE 108 (H) 11/01/2017 0836   BUN 21 (H) 11/01/2017 0836   CREATININE 1.05 11/01/2017 0836   CALCIUM 9.5 11/01/2017 0836   PROT 8.0 11/01/2017 0836   ALBUMIN 4.5 11/01/2017 0836   AST 37 11/01/2017 0836   ALT 45 11/01/2017 0836   ALKPHOS 60 11/01/2017 0836   BILITOT 1.2 11/01/2017 8546  GFRNONAA >60 11/01/2017 0836   GFRAA >60 11/01/2017 0836    Lab Results  Component Value Date   TSH 1.309 11/01/2017   TSH 7.37 (A) 10/08/2017   FREET4 1.08 11/01/2017    Results for Carlos, Payne (MRN [id number]) as of 11/25/2017 19:03  Ref. Range 11/01/2017 08:36 11/01/2017 08:43 11/01/2017 08:44  TSH Latest Ref Range: 0.350 - 4.500 uIU/mL   1.309  Triiodothyronine,Free,Serum Latest Ref Range: 2.0 - 4.4 pg/mL   4.2  T4,Free(Direct) Latest Ref Range: 0.61 - 1.12 ng/dL  1.08   Anti-Thyroglobulin Antibodies Latest Units: IU/mL <1.0    Thyroglobulin (ICMA) Latest Units: ng/mL 0.3    Thyroglobulin Latest Units: ng/mL Comment    Thyroglobulin Antibody Latest Ref Range: 0.0 - 0.9 IU/mL <1.0      October 08, 2017 lab work from outside facility showed calcium 9.3, TSH elevated at 7.3 7, free T4 low normal at 0.93     Review of his surgical pathology from August 24, 2017 showed 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  Tumor capsule was focally invaded by tumor, no extrathyroidal extension, margin free of tumor, lymphatic/vascular invasion present, no lymph nodes were examined.  TNM code p T3a, pNX .  -Thyrogen stimulated remnant ablation  was administered on October 22, 2017 followed by a negative whole-body scan on November 01, 2017.  Assessment & Plan:   1. Thyroid cancer (HCC)-Hurthle cell carcinoma 2. Postsurgical hypothyroidism  - I have reviewed his  available thyroid records and clinically evaluated the patient. -He has a stage 3 Hurthle cell carcinoma status post total thyroidectomy on 2 stages.  Left hemithyroidectomy on August 24, 2017 and completion right hemithyroidectomy on September 04, 2017 with no evidence of malignancy on the right side. 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 -Status post Thyrogen stimulated thyroid remnant ablation with negative whole-body scan.  -His thyroglobulin level is 0.3 associated with undetectable antithyroglobulin antibodies. -This completes his initial treatment for thyroid cancer.  -Risk of tumor recurrence in the next 5 - 10 years, and the need for consistent follow-up with periodic imaging studies is emphasized to the patient and his wife in exam room.  They voiced understanding.   -For his postsurgical hypothyroidism: -His current dose of Synthroid is appropriate.   Target TSH for him would be between 0.1-0.5. -I advised him to be consistent with his Synthroid 175 mcg p.o. every morning.     - We discussed about correct intake of levothyroxine, at fasting, with water, separated by at least 30 minutes from breakfast, and separated by more than 4 hours from calcium, iron, multivitamins, acid reflux medications (PPIs). -Patient is made aware of the fact that thyroid hormone replacement is needed for life, dose to be adjusted by periodic monitoring of thyroid function tests.    -He will have repeat free T4, TSH, free T3 with before his next visit, in 6 months.  -He is advised to continue his current dose of Os-Cal 500/200 p.o. twice daily.  He will have repeat CMP to monitor calcium.   - Time spent with the patient: 25 min, of which >50% was spent in reviewing his  current and  previous labs, previous treatments, and medications doses and developing a plan for long-term care.  Carlos Payne in the discussions, expressed understanding, and voiced  agreement with the above plans.  All questions were answered to his satisfaction. he is encouraged to contact clinic should he have any questions or concerns prior to his return visit.   - I advised patient to maintain close follow up with [facility] for primary care needs. Follow up plan: Return in about 6 months (around 05/27/2018) for follow up with pre-visit labs.   Glade Lloyd, MD [facility] [address] [place], [place] [address] Phone: [phone]  Fax: [phone]     11/25/2017, 7:02 PM  This note was partially dictated with voice recognition software. Similar sounding words can be transcribed inadequately or may not  be corrected upon review.

## 2018-01-02 ENCOUNTER — Other Ambulatory Visit: Payer: Self-pay | Admitting: "Endocrinology

## 2018-03-03 ENCOUNTER — Other Ambulatory Visit: Payer: Self-pay | Admitting: "Endocrinology

## 2018-03-10 ENCOUNTER — Ambulatory Visit (INDEPENDENT_AMBULATORY_CARE_PROVIDER_SITE_OTHER): Payer: BLUE CROSS/BLUE SHIELD | Admitting: Otolaryngology

## 2018-03-10 DIAGNOSIS — L0211 Cutaneous abscess of neck: Secondary | ICD-10-CM

## 2018-03-29 DIAGNOSIS — I517 Cardiomegaly: Secondary | ICD-10-CM | POA: Diagnosis not present

## 2018-03-29 DIAGNOSIS — C73 Malignant neoplasm of thyroid gland: Secondary | ICD-10-CM | POA: Diagnosis not present

## 2018-03-29 DIAGNOSIS — I5032 Chronic diastolic (congestive) heart failure: Secondary | ICD-10-CM | POA: Diagnosis not present

## 2018-03-29 DIAGNOSIS — Z0001 Encounter for general adult medical examination with abnormal findings: Secondary | ICD-10-CM | POA: Diagnosis not present

## 2018-03-29 DIAGNOSIS — E890 Postprocedural hypothyroidism: Secondary | ICD-10-CM | POA: Diagnosis not present

## 2018-03-29 DIAGNOSIS — I10 Essential (primary) hypertension: Secondary | ICD-10-CM | POA: Diagnosis not present

## 2018-03-29 DIAGNOSIS — Z681 Body mass index (BMI) 19 or less, adult: Secondary | ICD-10-CM | POA: Diagnosis not present

## 2018-03-31 ENCOUNTER — Ambulatory Visit (INDEPENDENT_AMBULATORY_CARE_PROVIDER_SITE_OTHER): Payer: BLUE CROSS/BLUE SHIELD | Admitting: Otolaryngology

## 2018-03-31 DIAGNOSIS — L0291 Cutaneous abscess, unspecified: Secondary | ICD-10-CM

## 2018-04-05 ENCOUNTER — Other Ambulatory Visit: Payer: Self-pay | Admitting: "Endocrinology

## 2018-05-25 ENCOUNTER — Other Ambulatory Visit (HOSPITAL_COMMUNITY)
Admission: RE | Admit: 2018-05-25 | Discharge: 2018-05-25 | Disposition: A | Discharge status: Home / Self Care | Payer: BLUE CROSS/BLUE SHIELD | Source: Ambulatory Visit | Attending: "Endocrinology | Admitting: "Endocrinology

## 2018-05-25 DIAGNOSIS — E890 Postprocedural hypothyroidism: Secondary | ICD-10-CM | POA: Insufficient documentation

## 2018-05-25 LAB — TSH: TSH: 2.01 u[IU]/mL (ref 0.350–4.500)

## 2018-05-25 LAB — T4, FREE: FREE T4: 0.99 ng/dL (ref 0.82–1.77)

## 2018-05-26 LAB — PTH, INTACT AND CALCIUM
Calcium, Total (PTH): 9.2 mg/dL (ref 8.7–10.2)
PTH: 16 pg/mL (ref 15–65)

## 2018-05-30 ENCOUNTER — Encounter: Payer: Self-pay | Admitting: "Endocrinology

## 2018-05-30 ENCOUNTER — Ambulatory Visit (INDEPENDENT_AMBULATORY_CARE_PROVIDER_SITE_OTHER): Payer: BLUE CROSS/BLUE SHIELD | Admitting: "Endocrinology

## 2018-05-30 VITALS — BP 133/83 | HR 73 | Ht 68.0 in | Wt 273.0 lb

## 2018-05-30 DIAGNOSIS — E890 Postprocedural hypothyroidism: Secondary | ICD-10-CM | POA: Diagnosis not present

## 2018-05-30 DIAGNOSIS — Z85850 Personal history of malignant neoplasm of thyroid: Secondary | ICD-10-CM | POA: Diagnosis not present

## 2018-05-30 MED ORDER — LEVOTHYROXINE SODIUM 200 MCG PO TABS: 200.0000 ug | ORAL_TABLET | Freq: Every day | ORAL | 6 refills | Status: DC

## 2018-05-30 MED ORDER — CALCIUM CARBONATE-VITAMIN D 500-200 MG-UNIT PO TABS: 1.0000 | ORAL_TABLET | Freq: Every day | ORAL | 2 refills | Status: DC

## 2018-05-30 NOTE — Progress Notes (Signed)
Endocrinology follow-up note                                            05/30/2018, 4:27 PM   Subjective:    Patient ID: Carlos Payne, male    DOB: April 08, 1961, PCP [facility]   Past Medical History:  Diagnosis Date  . Cancer [facility])    thyroid cancer  . Dyspnea   . GERD (gastroesophageal reflux disease)   . Hypertension   . Needle phobia    severe, needs to lie down for sticks  . Seizures ([place])    as a child  . Thyroid mass    left  . Wears hearing aid in both ears    Past Surgical History:  Procedure Laterality Date  . EYE SURGERY     foreign object removed  . FINGER SURGERY Right    index  . HERNIA REPAIR     age 14  . MULTIPLE TOOTH EXTRACTIONS    . THYROIDECTOMY Left 08/24/2017   Procedure: LEFT HEMI THYROIDECTOMY;  Surgeon: Leta Baptist, MD;  Location: [place];  Service: ENT;  Laterality: Left;  . THYROIDECTOMY N/A 09/04/2017   Procedure: COMPLETION THYROIDECTOMY;  Surgeon: Leta Baptist, MD;  Location: [facility];  Service: ENT;  Laterality: N/A;   Social History   Socioeconomic History  . Marital status: Married    Spouse name: Not on file  . Number of children: Not on file  . Years of education: Not on file  . Highest education level: Not on file  Occupational History  . Not on file  Social Needs  . Financial resource strain: Not on file  . Food insecurity:    Worry: Not on file    Inability: Not on file  . Transportation needs:    Medical: Not on file    Non-medical: Not on file  Tobacco Use  . Smoking status: Never Smoker  . Smokeless tobacco: Never Used  Substance and Sexual Activity  . Alcohol use: No    Frequency: Never  . Drug use: No  . Sexual activity: Not on file  Lifestyle  . Physical activity:    Days per week: Not on file    Minutes per session: Not on file  . Stress: Not on file  Relationships  . Social connections:    Talks on phone: Not on file    Gets together: Not on file    Attends religious  service: Not on file    Active member of club or organization: Not on file    Attends meetings of clubs or organizations: Not on file    Relationship status: Not on file  Other Topics Concern  . Not on file  Social History Narrative  . Not on file   Outpatient Encounter Medications as of 05/30/2018  Medication Sig  . acetaminophen (TYLENOL) 500 MG tablet Take 1,000 mg by mouth every 8 (eight) hours as needed for moderate pain.  Marland Kitchen docusate sodium (COLACE) 50 MG capsule Take 50 mg by mouth 2 (two) times daily.  . hydrochlorothiazide (HYDRODIURIL) 12.5 MG tablet daily.  Marland Kitchen levothyroxine (SYNTHROID, LEVOTHROID) 200 MCG tablet Take 1 tablet (200 mcg total) by mouth daily before breakfast.  . lisinopril-hydrochlorothiazide (PRINZIDE,ZESTORETIC) 20-25 MG tablet Take 1 tablet by mouth daily.  Marland Kitchen losartan (COZAAR) 50 MG tablet daily.  Marland Kitchen oxyCODONE-acetaminophen (  PERCOCET/ROXICET) 5-325 MG tablet Take 1-2 tablets by mouth every 4 (four) hours as needed for severe pain.  . [DISCONTINUED] calcium-vitamin D (OSCAL WITH D) 500-200 MG-UNIT tablet Take 2 tablets by mouth 2 (two) times daily. (Patient taking differently: Take 1 tablet by mouth 2 (two) times daily. )  . [DISCONTINUED] calcium-vitamin D (OSCAL WITH D) 500-200 MG-UNIT tablet Take 1 tablet by mouth daily with breakfast.  . [DISCONTINUED] levothyroxine (SYNTHROID, LEVOTHROID) 175 MCG tablet TAKE 1 TABLET (175 MCG TOTAL) BY MOUTH DAILY BEFORE BREAKFAST.  . [DISCONTINUED] levothyroxine (SYNTHROID, LEVOTHROID) 200 MCG tablet Take 1 tablet (200 mcg total) by mouth daily before breakfast.   No facility-administered encounter medications on file as of 05/30/2018.    ALLERGIES: No Known Allergies  VACCINATION STATUS:  There is no immunization history on file for this patient.  HPI Carlos Payne is 57 y.o. male who presents today with a medical history as above. he is being seen in follow-up for thyroid cancer status post near total thyroidectomy  and I-131 thyroid remnant ablation.   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  Completion surgery on September 04, 2017 with right lobe thyroidectomy showed benign thyroid parenchyma, benign parathyroid gland, with no evidence of malignancy.  -After his last visit with [place] here, he was scheduled to receive Thyrogen stimulated thyroid remnant ablation with I-131 which was delivered on October 22, 2017 followed by whole body scan on November 01, 2017.  His whole body scan was negative for distant metastasis. -He is currently on levothyroxine 175 mcg p.o. nightly.  He has not taken any calcium supplements since last visit.    -He denies prior history of dysfunction  Thyroid , parathyroid, pituitary, adrenal dysfunction personally or in his family. -He denies exposure to neck radiation. -He reports slower weight gain of 3 pounds since his last visit compared to 10 pounds of weight gain prior to his last visit.   -He denies new complaints.  -He denies dysphagia, odynophagia, change in his voice.  Review of Systems  Constitutional: + Weight gain,  -fatigue,  + subjective hypothermia Eyes: no blurry vision, no xerophthalmia ENT: no sore throat, no nodules palpated in throat, no dysphagia/odynophagia, no hoarseness Cardiovascular: no chest pain, no shortness of breath, no palpitations.   Musculoskeletal: no muscle/joint aches Skin: no rashes Neurological: no tremors, no numbness, no tingling, no dizziness Psychiatric: no depression, no anxiety  Objective:    BP 133/83   Pulse 73   Ht 5\' 8"  (1.727 m)   Wt 273 lb (123.8 kg)   BMI 41.51 kg/m   Wt Readings from Last 3 Encounters:  05/30/18 273 lb (123.8 kg)  11/25/17 265 lb (120.2 kg)  10/12/17 262 lb (118.8 kg)    Physical Exam  Constitutional: + Obese, not in acute distress, alert and oriented x3. + is  significantly hard of hearing. Eyes: PERRLA, EOMI, no exophthalmos ENT: moist mucous membranes, + well-healed post thyroidectomy scar on anterior lower neck,  no cervical lymphadenopathy Musculoskeletal: no gross deformities, strength intact in all four extremities Skin: moist, warm, no rashes Neurological: no tremor with outstretched hands, Deep tendon reflexes normal in all four extremities.  CMP ( most recent) CMP     Component Value Date/Time   NA 141 11/01/2017 0836   K 4.3 11/01/2017 0836   CL 102 11/01/2017 0836   CO2 28 11/01/2017 0836  GLUCOSE 108 (H) 11/01/2017 0836   BUN 21 (H) 11/01/2017 0836   CREATININE 1.05 11/01/2017 0836   CALCIUM 9.2 05/25/2018 1630   PROT 8.0 11/01/2017 0836   ALBUMIN 4.5 11/01/2017 0836   AST 37 11/01/2017 0836   ALT 45 11/01/2017 0836   ALKPHOS 60 11/01/2017 0836   BILITOT 1.2 11/01/2017 0836   GFRNONAA >60 11/01/2017 0836   GFRAA >60 11/01/2017 0836    Lab Results  Component Value Date   TSH 2.010 05/25/2018   TSH 1.309 11/01/2017   TSH 7.37 (A) 10/08/2017   FREET4 0.99 05/25/2018   FREET4 1.08 11/01/2017       October 08, 2017 lab work from outside facility showed calcium 9.3, TSH elevated at 7.3 7, free T4 low normal at 0.93     Review of his surgical pathology from August 24, 2017 showed 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  Tumor capsule was focally invaded by tumor, no extrathyroidal extension, margin free of tumor, lymphatic/vascular invasion present, no lymph nodes were examined.  TNM code p T3a, pNX .  -Thyrogen stimulated remnant ablation  was administered on October 22, 2017 followed by a negative whole-body scan on November 01, 2017.  Assessment & Plan:   1. Thyroid cancer (HCC)-Hurthle cell carcinoma 2. Postsurgical hypothyroidism  -He has had a stage 3 Hurthle cell carcinoma status post total thyroidectomy on 2 stages.  Left  hemithyroidectomy on August 24, 2017 and completion right hemithyroidectomy on September 04, 2017 with no evidence of malignancy on the right side. 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 -Status post Thyrogen stimulated thyroid remnant ablation with negative whole-body scan.  -His thyroglobulin level is 0.3 associated with undetectable antithyroglobulin antibodies. -This completes his initial treatment for thyroid cancer.  -Risk of tumor recurrence in the next 5 - 10 years, and the need for consistent follow-up with periodic imaging studies is emphasized to the patient and his wife in exam room.    -He will be scheduled for thyroid/neck ultrasound before his next visit.     -For his postsurgical hypothyroidism: -Based on his previsit thyroid function tests, he will benefit from a higher dose of levothyroxine.   Target TSH for him would be between 0.1-0.5 for up to 5 years. -I discussed and increased his levothyroxine to 200 mcg p.o. daily before breakfast.    - We discussed about correct intake of levothyroxine, at fasting, with water, separated by at least 30 minutes from breakfast, and separated by more than 4 hours from calcium, iron, multivitamins, acid reflux medications (PPIs). -Patient is made aware of the fact that thyroid hormone replacement is needed for life, dose to be adjusted by periodic monitoring of thyroid function tests.  -He will have repeat free T4, TSH, free T3 with before his next visit, in 6 months. -Without calcium supplement since last visit, his most recent calcium was normal at 9.2, along with normal PTH of 16.  He is advised to stay off of calcium supplements for now.  He will have a repeat CMP along with his next blood work in 3 months.   - I advised patient to maintain close follow up with [facility] for primary care needs. Follow up  plan: Return in about 3 months (around 08/30/2018) for Follow up with Pre-visit Labs,  Thyroid / Neck Ultrasound.   Glade Lloyd, MD [facility] [address] [place], [place] [address] Phone: [phone]  Fax: [phone]     05/30/2018, 4:27 PM  This note was partially dictated with voice recognition software. Similar sounding words can be transcribed inadequately or may not  be corrected upon review.

## 2018-05-31 ENCOUNTER — Telehealth: Payer: Self-pay | Admitting: "Endocrinology

## 2018-05-31 NOTE — Telephone Encounter (Signed)
Order sent to central sched to be scheduled at [facility]

## 2018-05-31 NOTE — Telephone Encounter (Signed)
Pt said he wants his ultrasound at [facility] please,

## 2018-06-05 ENCOUNTER — Other Ambulatory Visit: Payer: Self-pay | Admitting: "Endocrinology

## 2018-06-06 ENCOUNTER — Other Ambulatory Visit: Payer: Self-pay

## 2018-06-06 MED ORDER — LEVOTHYROXINE SODIUM 200 MCG PO TABS: 200.0000 ug | ORAL_TABLET | Freq: Every day | ORAL | 1 refills | Status: DC

## 2018-08-04 DIAGNOSIS — Z1212 Encounter for screening for malignant neoplasm of rectum: Secondary | ICD-10-CM | POA: Diagnosis not present

## 2018-08-04 DIAGNOSIS — Z1211 Encounter for screening for malignant neoplasm of colon: Secondary | ICD-10-CM | POA: Diagnosis not present

## 2018-08-19 ENCOUNTER — Ambulatory Visit (HOSPITAL_COMMUNITY)
Admission: RE | Admit: 2018-08-19 | Discharge: 2018-08-19 | Disposition: A | Discharge status: Home / Self Care | Payer: BLUE CROSS/BLUE SHIELD | Source: Ambulatory Visit | Attending: "Endocrinology | Admitting: "Endocrinology

## 2018-08-19 DIAGNOSIS — Z85850 Personal history of malignant neoplasm of thyroid: Secondary | ICD-10-CM

## 2018-08-19 DIAGNOSIS — Z9089 Acquired absence of other organs: Secondary | ICD-10-CM | POA: Diagnosis not present

## 2018-08-22 ENCOUNTER — Other Ambulatory Visit (HOSPITAL_COMMUNITY)
Admission: RE | Admit: 2018-08-22 | Discharge: 2018-08-22 | Disposition: A | Discharge status: Home / Self Care | Payer: BLUE CROSS/BLUE SHIELD | Source: Ambulatory Visit | Attending: "Endocrinology | Admitting: "Endocrinology

## 2018-08-22 DIAGNOSIS — E890 Postprocedural hypothyroidism: Secondary | ICD-10-CM | POA: Insufficient documentation

## 2018-08-22 DIAGNOSIS — Z85850 Personal history of malignant neoplasm of thyroid: Secondary | ICD-10-CM | POA: Insufficient documentation

## 2018-08-22 LAB — COMPREHENSIVE METABOLIC PANEL
ALBUMIN: 4.3 g/dL (ref 3.5–5.0)
ALK PHOS: 55 U/L (ref 38–126)
ALT: 80 U/L — ABNORMAL HIGH (ref 0–44)
AST: 45 U/L — ABNORMAL HIGH (ref 15–41)
Anion gap: 7 (ref 5–15)
BUN: 19 mg/dL (ref 6–20)
CALCIUM: 8.8 mg/dL — AB (ref 8.9–10.3)
CO2: 28 mmol/L (ref 22–32)
Chloride: 102 mmol/L (ref 98–111)
Creatinine, Ser: 1.03 mg/dL (ref 0.61–1.24)
GFR calc non Af Amer: >60 mL/min (ref >60)
GLUCOSE: 116 mg/dL — AB (ref 70–99)
POTASSIUM: 4 mmol/L (ref 3.5–5.1)
SODIUM: 137 mmol/L (ref 135–145)
TOTAL PROTEIN: 7.5 g/dL (ref 6.5–8.1)
Total Bilirubin: 0.8 mg/dL (ref 0.3–1.2)

## 2018-08-22 LAB — TSH: TSH: 0.204 u[IU]/mL — ABNORMAL LOW (ref 0.350–4.500)

## 2018-08-22 LAB — T4, FREE: Free T4: 1 ng/dL (ref 0.82–1.77)

## 2018-08-31 ENCOUNTER — Encounter: Payer: Self-pay | Admitting: "Endocrinology

## 2018-08-31 ENCOUNTER — Ambulatory Visit (INDEPENDENT_AMBULATORY_CARE_PROVIDER_SITE_OTHER): Payer: BLUE CROSS/BLUE SHIELD | Admitting: "Endocrinology

## 2018-08-31 VITALS — BP 141/100 | HR 98 | Resp 12 | Ht 68.0 in | Wt 284.8 lb

## 2018-08-31 DIAGNOSIS — E890 Postprocedural hypothyroidism: Secondary | ICD-10-CM

## 2018-08-31 DIAGNOSIS — Z85850 Personal history of malignant neoplasm of thyroid: Secondary | ICD-10-CM

## 2018-08-31 MED ORDER — CALCIUM CARBONATE 1250 (500 CA) MG PO CHEW: 1.0000 | CHEWABLE_TABLET | Freq: Every day | ORAL | 1 refills | Status: DC

## 2018-08-31 NOTE — Progress Notes (Signed)
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 Cancer [facility]    thyroid cancer  . Dyspnea   . GERD (gastroesophageal reflux disease)   . Hypertension   . Needle phobia    severe, needs to lie down for sticks  . Seizures ([place])    as a child  . Thyroid mass    left  . Wears hearing aid in both ears    Past Surgical History:  Procedure Laterality Date  . EYE SURGERY     foreign object removed  . FINGER SURGERY Right    index  . HERNIA REPAIR     age 45  . MULTIPLE TOOTH EXTRACTIONS    . THYROIDECTOMY Left 08/24/2017   Procedure: LEFT HEMI THYROIDECTOMY;  Surgeon: Leta Baptist, MD;  Location: [place];  Service: ENT;  Laterality: Left;  . THYROIDECTOMY N/A 09/04/2017   Procedure: COMPLETION THYROIDECTOMY;  Surgeon: Leta Baptist, MD;  Location[facility];  Service: ENT;  Laterality: N/A;   Social History   Socioeconomic History  . Marital status: Married    Spouse name: Not on file  . Number of children: Not on file  . Years of education: Not on file  . Highest education level: Not on file  Occupational History  . Not on file  Social Needs  . Financial resource strain: Not on file  . Food insecurity:    Worry: Not on file    Inability: Not on file  . Transportation needs:    Medical: Not on file    Non-medical: Not on file  Tobacco Use  . Smoking status: Never Smoker  . Smokeless tobacco: Never Used  Substance and Sexual Activity  . Alcohol use: No    Frequency: Never  . Drug use: No  . Sexual activity: Not on file  Lifestyle  . Physical activity:    Days per week: Not on file    Minutes per session: Not on file  . Stress: Not on file  Relationships  . Social connections:    Talks on phone: Not on file    Gets together: Not on file    Attends religious  service: Not on file    Active member of club or organization: Not on file    Attends meetings of clubs or organizations: Not on file    Relationship status: Not on file  Other Topics Concern  . Not on file  Social History Narrative  . Not on file   Outpatient Encounter Medications as of 08/31/2018  Medication Sig  . acetaminophen (TYLENOL) 500 MG tablet Take 1,000 mg by mouth every 8 (eight) hours as needed for moderate pain.  Marland Kitchen docusate sodium (COLACE) 50 MG capsule Take 50 mg by mouth 2 (two) times daily.  . hydrochlorothiazide (HYDRODIURIL) 12.5 MG tablet daily.  Marland Kitchen levothyroxine (SYNTHROID, LEVOTHROID) 200 MCG tablet Take 1 tablet (200 mcg total) by mouth daily before breakfast.  . lisinopril-hydrochlorothiazide (PRINZIDE,ZESTORETIC) 20-25 MG tablet Take 1 tablet by mouth daily.  Marland Kitchen losartan (COZAAR) 50 MG tablet daily.  Marland Kitchen oxyCODONE-acetaminophen (  PERCOCET/ROXICET) 5-325 MG tablet Take 1-2 tablets by mouth every 4 (four) hours as needed for severe pain.  . calcium carbonate (OS-CAL) 1250 (500 Ca) MG chewable tablet Chew 1 tablet (1,250 mg total) by mouth daily.   No facility-administered encounter medications on file as of 08/31/2018.    ALLERGIES: No Known Allergies  VACCINATION STATUS:  There is no immunization history on file for this patient.  HPI Carlos Payne is 58 y.o. male who presents today with a medical history as above. he is being seen in follow-up for thyroid cancer status post near total thyroidectomy and I-131 thyroid remnant ablation.   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  Completion surgery on September 04, 2017 with right lobe thyroidectomy showed benign thyroid parenchyma, benign parathyroid gland, with no evidence of malignancy.  -After his last visit with [place] here, he was scheduled to receive Thyrogen stimulated thyroid remnant  ablation with I-131 which was delivered on October 22, 2017 followed by whole body scan on November 01, 2017.  His whole body scan was negative for distant metastasis.  His previsit thyroid/neck ultrasound is negative for evidence of mass lesions. -He is currently on levothyroxine 200 mcg p.o. nightly.  He has not taken any calcium supplements since last visit.  His previsit labs show mild hypocalcemia of 8.8.  -He denies prior history of dysfunction  Thyroid , parathyroid, pituitary, adrenal dysfunction personally or in his family. -He denies exposure to neck radiation. -He reports 10 pound weight gain since last visit.   -He denies new complaints.  -He denies dysphagia, odynophagia, change in his voice.  Review of Systems  Constitutional: + Weight gain,  -fatigue,  + subjective hypothermia Eyes: no blurry vision, no xerophthalmia ENT: no sore throat, no nodules palpated in throat, no dysphagia/odynophagia, no hoarseness Cardiovascular: no chest pain, no shortness of breath, no palpitations.   Musculoskeletal: no muscle/joint aches Skin: no rashes Neurological: no tremors, no numbness, no tingling, no dizziness Psychiatric: no depression, no anxiety  Objective:    BP (!) 141/100   Pulse 98   Resp 12   Ht 5\' 8"  (1.727 m)   Wt 284 lb 12.8 oz (129.2 kg)   SpO2 96%   BMI 43.30 kg/m   Wt Readings from Last 3 Encounters:  08/31/18 284 lb 12.8 oz (129.2 kg)  05/30/18 273 lb (123.8 kg)  11/25/17 265 lb (120.2 kg)    Physical Exam  Constitutional: + Obese, not in acute distress, alert and oriented x3. + is significantly hard of hearing. Eyes: PERRLA, EOMI, no exophthalmos ENT: moist mucous membranes, + well-healed post thyroidectomy scar on anterior lower neck,  no cervical lymphadenopathy Musculoskeletal: no gross deformities, strength intact in all four extremities Skin: moist, warm, no rashes Neurological: no tremor with outstretched hands, Deep tendon reflexes normal in all four  extremities.  CMP ( most recent) CMP     Component Value Date/Time   NA 137 08/22/2018 1552   K 4.0 08/22/2018 1552   CL 102 08/22/2018 1552   CO2 28 08/22/2018 1552   GLUCOSE 116 (H) 08/22/2018 1552   BUN 19 08/22/2018 1552   CREATININE 1.03 08/22/2018 1552   CALCIUM 8.8 (L) 08/22/2018 1552   CALCIUM 9.2 05/25/2018 1630   PROT 7.5 08/22/2018 1552   ALBUMIN 4.3 08/22/2018 1552   AST 45 (H) 08/22/2018 1552   ALT 80 (  H) 08/22/2018 1552   ALKPHOS 55 08/22/2018 1552   BILITOT 0.8 08/22/2018 1552   GFRNONAA >60 08/22/2018 1552   GFRAA >60 08/22/2018 1552    Lab Results  Component Value Date   TSH 0.204 (L) 08/22/2018   TSH 2.010 05/25/2018   TSH 1.309 11/01/2017   TSH 7.37 (A) 10/08/2017   FREET4 1.00 08/22/2018   FREET4 0.99 05/25/2018   FREET4 1.08 11/01/2017       October 08, 2017 lab work from outside facility showed calcium 9.3, TSH elevated at 7.3 7, free T4 low normal at 0.93     Review of his surgical pathology from August 24, 2017 showed 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  Tumor capsule was focally invaded by tumor, no extrathyroidal extension, margin free of tumor, lymphatic/vascular invasion present, no lymph nodes were examined.  TNM code p T3a, pNX .  -Thyrogen stimulated remnant ablation  was administered on October 22, 2017 followed by a negative whole-body scan on November 01, 2017.  Ultrasound of neck on August 19, 2018 is negative for tumor recurrence.  Assessment & Plan:   1. Thyroid cancer (HCC)-Hurthle cell carcinoma 2. Postsurgical hypothyroidism  -He has had a stage 3 Hurthle cell carcinoma status post total thyroidectomy on 2 stages.  Left hemithyroidectomy on August 24, 2017 and completion right hemithyroidectomy on September 04, 2017 with no evidence of malignancy on the right side. -Hurthle cell carcinoma is a variant of follicular thyroid carcinoma, known to behave in  a more aggressive manner compared to papillary thyroid is normal. -Status post Thyrogen stimulated thyroid remnant ablation with negative whole-body scan.  -His thyroglobulin level is 0.3 associated with undetectable antithyroglobulin antibodies. -This completes his initial treatment for thyroid cancer.  -Risk of tumor recurrence in the next 5 - 10 years, and the need for consistent follow-up with periodic imaging studies is emphasized to the patient and his wife in exam room.    -His thyroid/neck ultrasound on August 19, 2018 is negative.  He will be considered for Thyrogen stimulated whole-body scan in 1 year.      -For his postsurgical hypothyroidism: -His previsit thyroid function tests are consistent with appropriate replacement with levothyroxine.   Target TSH for him would be between 0.1-0.5 for up to 5 years. -He is advised to continue levothyroxine  200 mcg p.o. daily before breakfast.    - We discussed about correct intake of levothyroxine, at fasting, with water, separated by at least 30 minutes from breakfast, and separated by more than 4 hours from calcium, iron, multivitamins, acid reflux medications (PPIs). -Patient is made aware of the fact that thyroid hormone replacement is needed for life, dose to be adjusted by periodic monitoring of thyroid function tests. -He will have repeat free T4, TSH, free T3 with before his next visit, in 6 months. -Without calcium supplement since last visit, his most recent calcium has dropped again to 8.8.  He is advised to resume his calcium supplement with calcium carbonate 1250 mg p.o. daily at breakfast.  He will have a repeat CMP along with his next blood work in 6 months.   - I advised patient to maintain close follow up with Practice, [facility] for primary care needs. Follow up plan: Return in about 6 months (around 03/01/2019) for Follow up with Pre-visit Labs.   Glade Lloyd, MD [facility] [phone]  [address] [place], [place] [address] Phone: [phone]  Fax: [phone]     08/31/2018, 4:16 PM  This note was partially dictated with voice recognition software. Similar sounding words can be transcribed inadequately or may not  be corrected upon review.

## 2018-10-05 DIAGNOSIS — I5032 Chronic diastolic (congestive) heart failure: Secondary | ICD-10-CM | POA: Diagnosis not present

## 2018-10-05 DIAGNOSIS — I10 Essential (primary) hypertension: Secondary | ICD-10-CM | POA: Diagnosis not present

## 2018-10-05 DIAGNOSIS — C73 Malignant neoplasm of thyroid gland: Secondary | ICD-10-CM | POA: Diagnosis not present

## 2018-10-05 DIAGNOSIS — E890 Postprocedural hypothyroidism: Secondary | ICD-10-CM | POA: Diagnosis not present

## 2018-11-23 ENCOUNTER — Other Ambulatory Visit: Payer: Self-pay

## 2018-11-23 MED ORDER — LEVOTHYROXINE SODIUM 200 MCG PO TABS: 200.0000 ug | ORAL_TABLET | Freq: Every day | ORAL | 1 refills | Status: DC

## 2018-11-24 DIAGNOSIS — H11439 Conjunctival hyperemia, unspecified eye: Secondary | ICD-10-CM | POA: Diagnosis not present

## 2018-11-24 DIAGNOSIS — T1502XA Foreign body in cornea, left eye, initial encounter: Secondary | ICD-10-CM | POA: Diagnosis not present

## 2018-11-25 DIAGNOSIS — T1502XA Foreign body in cornea, left eye, initial encounter: Secondary | ICD-10-CM | POA: Diagnosis not present

## 2019-02-20 DIAGNOSIS — I5032 Chronic diastolic (congestive) heart failure: Secondary | ICD-10-CM | POA: Diagnosis not present

## 2019-02-20 DIAGNOSIS — C73 Malignant neoplasm of thyroid gland: Secondary | ICD-10-CM | POA: Diagnosis not present

## 2019-02-20 DIAGNOSIS — I10 Essential (primary) hypertension: Secondary | ICD-10-CM | POA: Diagnosis not present

## 2019-02-20 DIAGNOSIS — E890 Postprocedural hypothyroidism: Secondary | ICD-10-CM | POA: Diagnosis not present

## 2019-02-20 DIAGNOSIS — R945 Abnormal results of liver function studies: Secondary | ICD-10-CM | POA: Diagnosis not present

## 2019-02-20 DIAGNOSIS — I517 Cardiomegaly: Secondary | ICD-10-CM | POA: Diagnosis not present

## 2019-02-20 DIAGNOSIS — Z6838 Body mass index (BMI) 38.0-38.9, adult: Secondary | ICD-10-CM | POA: Diagnosis not present

## 2019-02-20 DIAGNOSIS — Z1331 Encounter for screening for depression: Secondary | ICD-10-CM | POA: Diagnosis not present

## 2019-03-01 ENCOUNTER — Ambulatory Visit (INDEPENDENT_AMBULATORY_CARE_PROVIDER_SITE_OTHER): Payer: BC Managed Care – PPO | Admitting: "Endocrinology

## 2019-03-01 ENCOUNTER — Encounter: Payer: Self-pay | Admitting: "Endocrinology

## 2019-03-01 ENCOUNTER — Other Ambulatory Visit: Payer: Self-pay

## 2019-03-01 VITALS — BP 140/72 | HR 79 | Ht 68.0 in | Wt 273.0 lb

## 2019-03-01 DIAGNOSIS — Z85850 Personal history of malignant neoplasm of thyroid: Secondary | ICD-10-CM | POA: Diagnosis not present

## 2019-03-01 DIAGNOSIS — E890 Postprocedural hypothyroidism: Secondary | ICD-10-CM | POA: Diagnosis not present

## 2019-03-01 MED ORDER — LEVOTHYROXINE SODIUM 200 MCG PO TABS: 200.0000 ug | ORAL_TABLET | Freq: Every day | ORAL | 2 refills | Status: DC

## 2019-03-01 NOTE — Progress Notes (Signed)
Endocrinology follow-up note                                            03/01/2019, 4:39 PM   Subjective:    Patient ID: Carlos Payne, male    DOB: 03/03/1961, PCP Practice, [facility]   Past Medical History:  Diagnosis Date  . Cancer [facility])    thyroid cancer  . Dyspnea   . GERD (gastroesophageal reflux disease)   . Hypertension   . Needle phobia    severe, needs to lie down for sticks  . Seizures ([place])    as a child  . Thyroid mass    left  . Wears hearing aid in both ears    Past Surgical History:  Procedure Laterality Date  . EYE SURGERY     foreign object removed  . FINGER SURGERY Right    index  . HERNIA REPAIR     age 9  . MULTIPLE TOOTH EXTRACTIONS    . THYROIDECTOMY Left 08/24/2017   Procedure: LEFT HEMI THYROIDECTOMY;  Surgeon: Leta Baptist, MD;  Location: [place];  Service: ENT;  Laterality: Left;  . THYROIDECTOMY N/A 09/04/2017   Procedure: COMPLETION THYROIDECTOMY;  Surgeon: Leta Baptist, MD;  Location: [facility];  Service: ENT;  Laterality: N/A;   Social History   Socioeconomic History  . Marital status: Married    Spouse name: Not on file  . Number of children: Not on file  . Years of education: Not on file  . Highest education level: Not on file  Occupational History  . Not on file  Social Needs  . Financial resource strain: Not on file  . Food insecurity    Worry: Not on file    Inability: Not on file  . Transportation needs    Medical: Not on file    Non-medical: Not on file  Tobacco Use  . Smoking status: Never Smoker  . Smokeless tobacco: Never Used  Substance and Sexual Activity  . Alcohol use: No    Frequency: Never  . Drug use: No  . Sexual activity: Not on file  Lifestyle  . Physical activity    Days per week: Not on file    Minutes per session: Not on file  . Stress: Not on file  Relationships  . Social Herbalist on phone: Not on file    Gets together: Not on file    Attends religious  service: Not on file    Active member of club or organization: Not on file    Attends meetings of clubs or organizations: Not on file    Relationship status: Not on file  Other Topics Concern  . Not on file  Social History Narrative  . Not on file   Outpatient Encounter Medications as of 03/01/2019  Medication Sig  . acetaminophen (TYLENOL) 500 MG tablet Take 1,000 mg by mouth every 8 (eight) hours as needed for moderate pain.  . calcium carbonate (OS-CAL) 1250 (500 Ca) MG chewable tablet Chew 1 tablet (1,250 mg total) by mouth daily.  Marland Kitchen docusate sodium (COLACE) 50 MG capsule Take 50 mg by mouth 2 (two) times daily.  . hydrochlorothiazide (HYDRODIURIL) 12.5 MG tablet daily.  Marland Kitchen levothyroxine (SYNTHROID) 200 MCG tablet Take 1 tablet (200 mcg total) by mouth daily before breakfast.  . lisinopril-hydrochlorothiazide (PRINZIDE,ZESTORETIC) 20-25  MG tablet Take 1 tablet by mouth daily.  Marland Kitchen losartan (COZAAR) 50 MG tablet daily.  Marland Kitchen oxyCODONE-acetaminophen (PERCOCET/ROXICET) 5-325 MG tablet Take 1-2 tablets by mouth every 4 (four) hours as needed for severe pain.  . [DISCONTINUED] levothyroxine (SYNTHROID, LEVOTHROID) 200 MCG tablet Take 1 tablet (200 mcg total) by mouth daily before breakfast.   No facility-administered encounter medications on file as of 03/01/2019.    ALLERGIES: No Known Allergies  VACCINATION STATUS:  There is no immunization history on file for this patient.  HPI Carlos Payne is 58 y.o. male who presents today with a medical history as above. he is being seen in follow-up for thyroid cancer status post near total thyroidectomy and I-131 thyroid remnant ablation.   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  Completion surgery on September 04, 2017 with right lobe thyroidectomy showed benign thyroid parenchyma, benign parathyroid gland, with no  evidence of malignancy.  -After his last visit with [place] here, he was scheduled to receive Thyrogen stimulated thyroid remnant ablation with I-131 which was delivered on October 22, 2017 followed by whole body scan on November 01, 2017.  His whole body scan was negative for distant metastasis.  His previsit thyroid/neck ultrasound is negative for evidence of mass lesions. -He is currently on levothyroxine 200 mcg p.o. nightly.  He is taking calcium carbonate 1250 mg daily with breakfast.  His previsit labs show calcium of 9.9.  His labs also show PTH low at 12.  -He denies exposure to neck radiation. -He reports 10 pound weight loss since last visit.   -He denies new complaints.  -He denies dysphagia, odynophagia, change in his voice.  Review of Systems  Constitutional: + Weight loss,  -fatigue,  + subjective hypothermia Eyes: no blurry vision, no xerophthalmia ENT: no sore throat, no nodules palpated in throat, no dysphagia/odynophagia, no hoarseness Cardiovascular: no chest pain, no shortness of breath, no palpitations.   Musculoskeletal: no muscle/joint aches Skin: no rashes Neurological: no tremors, no numbness, no tingling, no dizziness Psychiatric: no depression, no anxiety  Objective:    BP 140/72   Pulse 79   Ht 5\' 8"  (1.727 m)   Wt 273 lb (123.8 kg)   BMI 41.51 kg/m   Wt Readings from Last 3 Encounters:  03/01/19 273 lb (123.8 kg)  08/31/18 284 lb 12.8 oz (129.2 kg)  05/30/18 273 lb (123.8 kg)    Physical Exam  Constitutional: + Obese, not in acute distress, alert and oriented x3. + is significantly hard of hearing. Eyes: PERRLA, EOMI, no exophthalmos ENT: moist mucous membranes, + well-healed post thyroidectomy scar on anterior  Respiratory: Adequate breathing efforts Musculoskeletal: no gross deformities, strength intact in all four extremities Skin:  no rashes, no hyperemia Neurological: no tremor with outstretched hands.   CMP ( most recent) CMP     Component  Value Date/Time   NA 137 08/22/2018 1552   K 4.0 08/22/2018 1552   CL 102 08/22/2018 1552   CO2 28 08/22/2018 1552   GLUCOSE 116 (H) 08/22/2018 1552   BUN 19 08/22/2018 1552   CREATININE 1.03 08/22/2018 1552   CALCIUM 8.8 (L) 08/22/2018 1552   CALCIUM 9.2 05/25/2018 1630   PROT 7.5 08/22/2018 1552   ALBUMIN 4.3 08/22/2018 1552   AST 45 (H) 08/22/2018 1552   ALT 80 (H) 08/22/2018 1552   ALKPHOS 55 08/22/2018 1552  BILITOT 0.8 08/22/2018 1552   GFRNONAA >60 08/22/2018 1552   GFRAA >60 08/22/2018 1552    Lab Results  Component Value Date   TSH 0.204 (L) 08/22/2018   TSH 2.010 05/25/2018   TSH 1.309 11/01/2017   TSH 7.37 (A) 10/08/2017   FREET4 1.00 08/22/2018   FREET4 0.99 05/25/2018   FREET4 1.08 11/01/2017       October 08, 2017 lab work from outside facility showed calcium 9.3, TSH elevated at 7.3 7, free T4 low normal at 0.93     Review of his surgical pathology from August 24, 2017 showed 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  Tumor capsule was focally invaded by tumor, no extrathyroidal extension, margin free of tumor, lymphatic/vascular invasion present, no lymph nodes were examined.  TNM code p T3a, pNX .  -Thyrogen stimulated remnant ablation  was administered on October 22, 2017 followed by a negative whole-body scan on November 01, 2017.  Ultrasound of neck on August 19, 2018 is negative for tumor recurrence.  Assessment & Plan:   1. Thyroid cancer (HCC)-Hurthle cell carcinoma 2. Postsurgical hypothyroidism  -He has had a stage 3 Hurthle cell carcinoma status post total thyroidectomy on 2 stages.  Left hemithyroidectomy on August 24, 2017 and completion right hemithyroidectomy on September 04, 2017 with no evidence of malignancy on the right side. -Hurthle cell carcinoma is a variant of follicular thyroid carcinoma, known to behave in a more aggressive manner compared to papillary thyroid  is normal. -Status post Thyrogen stimulated thyroid remnant ablation with negative whole-body scan.  -His thyroglobulin level is 0.3 associated with undetectable antithyroglobulin antibodies. -This completes his initial treatment for thyroid cancer.  -Risk of tumor recurrence in the next 5 - 10 years, and the need for consistent follow-up with periodic imaging studies is emphasized to the patient and his wife in exam room.    -His thyroid/neck ultrasound on August 19, 2018 is negative.  He will be considered for Thyrogen stimulated whole-body scan in 6 months.       -For his postsurgical hypothyroidism: -His previsit thyroid function tests are consistent with appropriate replacement with levothyroxine.  Target TSH for him would be between 0.1-0.5 for up to 5 years.  -He is advised to continue levothyroxine  200 mcg p.o. daily before breakfast.    - We discussed about the correct intake of his thyroid hormone, on empty stomach at fasting, with water, separated by at least 30 minutes from breakfast and other medications,  and separated by more than 4 hours from calcium, iron, multivitamins, acid reflux medications (PPIs). -Patient is made aware of the fact that thyroid hormone replacement is needed for life, dose to be adjusted by periodic monitoring of thyroid function tests.  -His repeat labs show calcium of 9.9, responding to supplement.   He is advised to continue calcium supplement with calcium carbonate 1250 mg p.o. daily at breakfast.  He will have a repeat CMP along with his next blood work in 6 months.   - I advised patient to maintain close follow up with Practice, [facility] for primary care needs.   Time for this visit: 15 minutes. Carlos Payne  participated in the discussions, expressed understanding, and voiced agreement with the above plans.  All questions were answered to his satisfaction. he is encouraged to contact clinic should he have any questions or concerns prior  to his return visit.  Follow up plan: Return in about 6 months (around 09/01/2019) for Follow up with Whole Body Scan w/Thyrogen, Follow up with Pre-visit Labs.   Glade Lloyd, MD [facility] [address] [place], [place] [address] Phone: [phone]  Fax: [phone]     03/01/2019, 4:39 PM  This note was partially dictated with voice recognition software. Similar sounding words can be transcribed inadequately or may not  be corrected upon review.

## 2019-04-22 DIAGNOSIS — G4733 Obstructive sleep apnea (adult) (pediatric): Secondary | ICD-10-CM | POA: Diagnosis not present

## 2019-05-24 ENCOUNTER — Other Ambulatory Visit: Payer: Self-pay | Admitting: "Endocrinology

## 2019-08-08 DIAGNOSIS — J069 Acute upper respiratory infection, unspecified: Secondary | ICD-10-CM | POA: Diagnosis not present

## 2019-08-11 IMAGING — NM NM RAI THYROID CANCER W/ THYROGEN
1 series · 1 of 1 positions shown · non-contrast
Comparison: none

CLINICAL DATA: Hurthle cell carcinoma of the thyroid gland post
thyroidectomy

[Series 1: bone statics · 2.07mm/px · 1 of 1 slices shown]
[im 1/1]
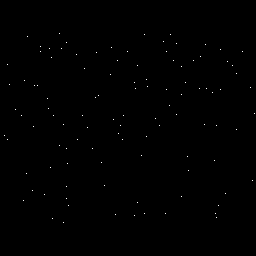

[1 of 1 positions shown; findings below may reference images not displayed]

EXAM:
RADIOACTIVE IODINE THERAPY FOR THYROID CANCER

PROCEDURE:
The risks and benefits of radioactive iodine therapy were discussed
with the patient in detail. Alternative therapies were also
mentioned. Radiation safety was discussed with the patient,
including how to protect the general public from exposure. There
were no barriers to communication. Written consent was obtained by
Dr. Melvi Ghaly. The patient then received a capsule containing
the radiopharmaceutical. Whole-body imaging to be performed 10 days
post therapy.

The patient will follow-up with the referring physician.

RADIOPHARMACEUTICALS:  153 mCi 8-6T6 sodium iodide orally
FINDINGS: As above
IMPRESSION: Per oral administration of 153 mCi of 8-6T6 sodium iodide for
thyroid cancer adjuvant therapy.

## 2019-08-16 ENCOUNTER — Encounter (HOSPITAL_COMMUNITY)
Admission: RE | Admit: 2019-08-16 | Discharge: 2019-08-16 | Disposition: A | Discharge status: Home / Self Care | Payer: BC Managed Care – PPO | Source: Ambulatory Visit | Attending: "Endocrinology | Admitting: "Endocrinology

## 2019-08-16 ENCOUNTER — Encounter (HOSPITAL_COMMUNITY): Payer: Self-pay

## 2019-08-16 ENCOUNTER — Other Ambulatory Visit: Payer: Self-pay

## 2019-08-16 DIAGNOSIS — Z85850 Personal history of malignant neoplasm of thyroid: Secondary | ICD-10-CM | POA: Diagnosis not present

## 2019-08-16 MED ORDER — THYROTROPIN ALFA 1.1 MG IM SOLR
0.9000 mg | INTRAMUSCULAR | Status: AC
  Administered 2019-08-16: 0.9 mg via INTRAMUSCULAR

## 2019-08-16 MED ORDER — THYROTROPIN ALFA 1.1 MG IM SOLR
INTRAMUSCULAR | Status: AC
  Filled 2019-08-16: qty 0.9

## 2019-08-16 MED ORDER — STERILE WATER FOR INJECTION IJ SOLN
INTRAMUSCULAR | Status: AC
  Administered 2019-08-16: 1 mL
  Filled 2019-08-16: qty 10

## 2019-08-17 ENCOUNTER — Encounter (HOSPITAL_COMMUNITY)
Admission: RE | Admit: 2019-08-17 | Discharge: 2019-08-17 | Disposition: A | Discharge status: Home / Self Care | Payer: BC Managed Care – PPO | Source: Ambulatory Visit | Attending: "Endocrinology | Admitting: "Endocrinology

## 2019-08-17 DIAGNOSIS — Z85850 Personal history of malignant neoplasm of thyroid: Secondary | ICD-10-CM | POA: Diagnosis not present

## 2019-08-17 MED ORDER — THYROTROPIN ALFA 1.1 MG IM SOLR
0.9000 mg | INTRAMUSCULAR | Status: AC
  Administered 2019-08-17: 0.9 mg via INTRAMUSCULAR

## 2019-08-17 MED ORDER — STERILE WATER FOR INJECTION IJ SOLN
INTRAMUSCULAR | Status: AC
  Filled 2019-08-17: qty 10

## 2019-08-17 MED ORDER — STERILE WATER FOR INJECTION IJ SOLN
1.0000 mL | Freq: Once | INTRAMUSCULAR | Status: AC
  Administered 2019-08-17: 1 mL via INTRAMUSCULAR

## 2019-08-17 MED ORDER — THYROTROPIN ALFA 1.1 MG IM SOLR
INTRAMUSCULAR | Status: AC
  Filled 2019-08-17: qty 0.9

## 2019-08-18 ENCOUNTER — Other Ambulatory Visit (HOSPITAL_COMMUNITY)
Admission: RE | Admit: 2019-08-18 | Discharge: 2019-08-18 | Disposition: A | Discharge status: Home / Self Care | Payer: BC Managed Care – PPO | Source: Ambulatory Visit | Attending: "Endocrinology | Admitting: "Endocrinology

## 2019-08-18 ENCOUNTER — Encounter (HOSPITAL_COMMUNITY): Payer: Self-pay

## 2019-08-18 ENCOUNTER — Other Ambulatory Visit: Payer: Self-pay

## 2019-08-18 ENCOUNTER — Encounter (HOSPITAL_COMMUNITY)
Admission: RE | Admit: 2019-08-18 | Discharge: 2019-08-18 | Disposition: A | Discharge status: Home / Self Care | Payer: BC Managed Care – PPO | Source: Ambulatory Visit | Attending: "Endocrinology | Admitting: "Endocrinology

## 2019-08-18 DIAGNOSIS — E890 Postprocedural hypothyroidism: Secondary | ICD-10-CM | POA: Diagnosis not present

## 2019-08-18 DIAGNOSIS — Z85850 Personal history of malignant neoplasm of thyroid: Secondary | ICD-10-CM | POA: Insufficient documentation

## 2019-08-18 LAB — T4, FREE: Free T4: 1.27 ng/dL — ABNORMAL HIGH (ref 0.61–1.12)

## 2019-08-18 MED ORDER — SODIUM IODIDE I 131 CAPSULE
4.0000 | Freq: Once | INTRAVENOUS | Status: AC | PRN
  Administered 2019-08-18: 4.1 via ORAL

## 2019-08-19 LAB — T3, FREE: T3, Free: 3.9 pg/mL (ref 2.0–4.4)

## 2019-08-19 LAB — THYROGLOBULIN ANTIBODY: Thyroglobulin Antibody: <1 IU/mL (ref 0.0–0.9)

## 2019-08-20 ENCOUNTER — Other Ambulatory Visit: Payer: Self-pay | Admitting: "Endocrinology

## 2019-08-21 ENCOUNTER — Encounter (HOSPITAL_COMMUNITY)
Admission: RE | Admit: 2019-08-21 | Discharge: 2019-08-21 | Disposition: A | Discharge status: Home / Self Care | Payer: BC Managed Care – PPO | Source: Ambulatory Visit | Attending: "Endocrinology | Admitting: "Endocrinology

## 2019-08-21 ENCOUNTER — Other Ambulatory Visit: Payer: Self-pay

## 2019-08-21 DIAGNOSIS — C73 Malignant neoplasm of thyroid gland: Secondary | ICD-10-CM | POA: Diagnosis not present

## 2019-08-21 DIAGNOSIS — Z85850 Personal history of malignant neoplasm of thyroid: Secondary | ICD-10-CM | POA: Diagnosis not present

## 2019-08-26 DIAGNOSIS — Z20828 Contact with and (suspected) exposure to other viral communicable diseases: Secondary | ICD-10-CM | POA: Diagnosis not present

## 2019-08-28 LAB — THYROGLOBULIN LEVEL: Thyroglobulin: <2 ng/mL

## 2019-08-31 DIAGNOSIS — Z20828 Contact with and (suspected) exposure to other viral communicable diseases: Secondary | ICD-10-CM | POA: Diagnosis not present

## 2019-09-14 ENCOUNTER — Ambulatory Visit (INDEPENDENT_AMBULATORY_CARE_PROVIDER_SITE_OTHER): Payer: BC Managed Care – PPO | Admitting: "Endocrinology

## 2019-09-14 ENCOUNTER — Encounter: Payer: Self-pay | Admitting: "Endocrinology

## 2019-09-14 DIAGNOSIS — E890 Postprocedural hypothyroidism: Secondary | ICD-10-CM | POA: Diagnosis not present

## 2019-09-14 DIAGNOSIS — Z85850 Personal history of malignant neoplasm of thyroid: Secondary | ICD-10-CM

## 2019-09-14 MED ORDER — LEVOTHYROXINE SODIUM 175 MCG PO TABS: 175.0000 ug | ORAL_TABLET | Freq: Every day | ORAL | 1 refills | Status: DC

## 2019-09-14 NOTE — Progress Notes (Signed)
09/14/2019, 5:16 PM                                Endocrinology Telehealth Visit Follow up Note -During COVID -19 Pandemic  I connected with Carlos Payne on 09/14/2019   by telephone and verified that I am speaking with the correct person using two identifiers. Carlos Payne, July 12, 1961. he has verbally consented to this visit. All issues noted in this document were discussed and addressed. The format was not optimal for physical exam.   Subjective:    Patient ID: Carlos Payne, male    DOB: April 05, 1961, PCP Burdine, Virgina Evener, MD   Past Medical History:  Diagnosis Date  . Cancer [place])    thyroid cancer  . Dyspnea   . GERD (gastroesophageal reflux disease)   . Hypertension   . Needle phobia    severe, needs to lie down for sticks  . Seizures ([place])    as a child  . Thyroid mass    left  . Wears hearing aid in both ears    Past Surgical History:  Procedure Laterality Date  . EYE SURGERY     foreign object removed  . FINGER SURGERY Right    index  . HERNIA REPAIR     age 89  . MULTIPLE TOOTH EXTRACTIONS    . THYROIDECTOMY Left 08/24/2017   Procedure: LEFT HEMI THYROIDECTOMY;  Surgeon: Leta Baptist, MD;  Location: [facility];  Service: ENT;  Laterality: Left;  . THYROIDECTOMY N/A 09/04/2017   Procedure: COMPLETION THYROIDECTOMY;  Surgeon: Leta Baptist, MD;  Location[facility];  Service: ENT;  Laterality: N/A;   Social History   Socioeconomic History  . Marital status: Married    Spouse name: Not on file  . Number of children: Not on file  . Years of education: Not on file  . Highest education level: Not on file  Occupational History  . Not on file  Tobacco Use  . Smoking status: Never Smoker  . Smokeless tobacco: Never Used  Substance and Sexual Activity  . Alcohol use: No  . Drug use: No  . Sexual activity: Not on file  Other Topics Concern  . Not on file  Social History Narrative  . Not on file   Social  Determinants of Health   Financial Resource Strain:   . Difficulty of Paying Living Expenses: Not on file  Food Insecurity:   . Worried About Charity fundraiser in the Last Year: Not on file  . Ran Out of Food in the Last Year: Not on file  Transportation Needs:   . Lack of Transportation (Medical): Not on file  . Lack of Transportation (Non-Medical): Not on file  Physical Activity:   . Days of Exercise per Week: Not on file  . Minutes of Exercise per Session: Not on file  Stress:   . Feeling of Stress : Not on file  Social Connections:   . Frequency of Communication with Friends and Family: Not on file  . Frequency of Social Gatherings with Friends and Family: Not on file  . Attends Religious Services: Not on file  . Active Member  of Clubs or Organizations: Not on file  . Attends Archivist Meetings: Not on file  . Marital Status: Not on file   Outpatient Encounter Medications as of 09/14/2019  Medication Sig  . acetaminophen (TYLENOL) 500 MG tablet Take 1,000 mg by mouth every 8 (eight) hours as needed for moderate pain.  . calcium carbonate (OS-CAL) 1250 (500 Ca) MG chewable tablet Chew 1 tablet (1,250 mg total) by mouth daily.  Marland Kitchen docusate sodium (COLACE) 50 MG capsule Take 50 mg by mouth 2 (two) times daily.  . hydrochlorothiazide (HYDRODIURIL) 12.5 MG tablet daily.  Marland Kitchen levothyroxine (SYNTHROID) 175 MCG tablet Take 1 tablet (175 mcg total) by mouth daily before breakfast.  . lisinopril-hydrochlorothiazide (PRINZIDE,ZESTORETIC) 20-25 MG tablet Take 1 tablet by mouth daily.  Marland Kitchen losartan (COZAAR) 50 MG tablet daily.  Marland Kitchen oxyCODONE-acetaminophen (PERCOCET/ROXICET) 5-325 MG tablet Take 1-2 tablets by mouth every 4 (four) hours as needed for severe pain.  . [DISCONTINUED] levothyroxine (SYNTHROID) 200 MCG tablet TAKE 1 TABLET BY MOUTH ONCE DAILY BEFORE  BREAKFAST   No facility-administered encounter medications on file as of 09/14/2019.   ALLERGIES: No Known  Allergies  VACCINATION STATUS:  There is no immunization history on file for this patient.  HPI Carlos Payne is 59 y.o. male who presents today with a medical history as above. he is being seen in follow-up for thyroid cancer status post near total thyroidectomy and I-131 thyroid remnant ablation.   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  Completion surgery on September 04, 2017 with right lobe thyroidectomy showed benign thyroid parenchyma, benign parathyroid gland, with no evidence of malignancy.  -After his last visit with [place] here, he was scheduled to receive Thyrogen stimulated thyroid remnant ablation with I-131 which was delivered on October 22, 2017 followed by whole body scan on November 01, 2017.  His whole body scan was negative for distant metastasis.  His previsit thyroid/neck ultrasound is negative for evidence of mass lesions.  -His previsit thyroid and stimulated whole-body scan was negative for actually evidence of relapse or recurrence. -He is currently on levothyroxine 200 mcg p.o. nightly.  He is taking calcium the form of Tums 5 tablets daily his previsit labs show serum of 8.8, increasing from 9.9.    Previously, his labs showed no PTH of 12.  -He denies exposure to neck radiation. -He reports 20  pound weight loss since last  Year.   -He denies new complaints.  -He denies dysphagia, odynophagia, change in his voice.  Review of Systems  Limited as above.  Objective:    There were no vitals taken for this visit.  Wt Readings from Last 3 Encounters:  03/01/19 273 lb (123.8 kg)  08/31/18 284 lb 12.8 oz (129.2 kg)  05/30/18 273 lb (123.8 kg)    Physical Exam   CMP     Component Value Date/Time   NA 137 08/22/2018 1552   K 4.0 08/22/2018 1552   CL 102 08/22/2018 1552   CO2 28 08/22/2018 1552   GLUCOSE 116 (H) 08/22/2018 1552    BUN 19 08/22/2018 1552   CREATININE 1.03 08/22/2018 1552   CALCIUM 8.8 (L) 08/22/2018 1552   CALCIUM 9.2 05/25/2018 1630   PROT 7.5 08/22/2018 1552   ALBUMIN 4.3 08/22/2018 1552   AST 45 (H) 08/22/2018 1552   ALT 80 (H) 08/22/2018 1552   ALKPHOS  55 08/22/2018 1552   BILITOT 0.8 08/22/2018 1552   GFRNONAA >60 08/22/2018 1552   GFRAA >60 08/22/2018 1552    Lab Results  Component Value Date   TSH 0.204 (L) 08/22/2018   TSH 2.010 05/25/2018   TSH 1.309 11/01/2017   TSH 7.37 (A) 10/08/2017   FREET4 1.27 (H) 08/18/2019   FREET4 1.00 08/22/2018   FREET4 0.99 05/25/2018   FREET4 1.08 11/01/2017       October 08, 2017 lab work from outside facility showed calcium 9.3, TSH elevated at 7.3 7, free T4 low normal at 0.93     Review of his surgical pathology from August 24, 2017 showed 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  Tumor capsule was focally invaded by tumor, no extrathyroidal extension, margin free of tumor, lymphatic/vascular invasion present, no lymph nodes were examined.  TNM code p T3a, pNX .  -Thyrogen stimulated remnant ablation  was administered on October 22, 2017 followed by a negative whole-body scan on November 01, 2017.  Ultrasound of neck on August 19, 2018 is negative for tumor recurrence.   Thyrogen stimulated whole-body scan on August 21, 2019  FINDINGS: Physiologic activity within the nasopharynx, liver and bladder. There is no abnormal activity within the neck or elsewhere to suggest local recurrence or metastatic disease.  IMPRESSION: Normal examination post thyroidectomy and thyroid ablation. No evidence of local recurrence or metastatic disease.  Assessment & Plan:   1. Thyroid cancer (HCC)-Hurthle cell carcinoma 2. Postsurgical hypothyroidism 3.  Postsurgical hypocalcemia  -He has had a stage 3 Hurthle cell carcinoma status post total thyroidectomy on 2 stages.  Left  hemithyroidectomy on August 24, 2017 and completion right hemithyroidectomy on September 04, 2017 with no evidence of malignancy on the right side. 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 -Status post Thyrogen stimulated thyroid remnant ablation with negative whole-body scan.  -His thyroglobulin level is 0.3 associated with undetectable antithyroglobulin antibodies. -This completes his initial treatment for thyroid cancer.  -Risk of tumor recurrence in the next 5 - 10 years, and the need for consistent follow-up with periodic imaging studies is emphasized to the patient and his wife in exam room.    -His thyroid/neck ultrasound on August 19, 2018 is negative.  -His previsit Thyrogen stimulated whole-body scan -normal exam post thyroidectomy and thyroid ablation.  No evidence of local recurrence or metastatic disease.   -For his postsurgical hypothyroidism: -His previsit thyroid function tests are consistent with over replacement, mainly due to his 20 pounds of weight loss since last year.   Target TSH for him would be between 0.1-0.5 for up to 5 years.  -He is advised to lower levothyroxine to 175 mcg p.o. daily before breakfast.     - We discussed about the correct intake of his thyroid hormone, on empty stomach at fasting, with water, separated by at least 30 minutes from breakfast and other medications,  and separated by more than 4 hours from calcium, iron, multivitamins, acid reflux medications (PPIs). -Patient is made aware of the fact that thyroid hormone replacement is needed for life, dose to be adjusted by periodic monitoring of thyroid function tests.  -His repeat labs show calcium of 8.8 dropping from 9.9.  He is advised to continue supplementing with Tums, 2 tablets 3 times daily AC.  He will have a repeat CMP along with his next blood work in 6 months.   - I advised  patient to maintain close follow up with Burdine, Virgina Evener, MD for primary care needs.     - Time spent on this patient care encounter:  25 minutes of which 50% was spent in  counseling and the rest reviewing  his current and  previous labs / studies and medications  doses and developing a plan for long term care. Carlos Payne  participated in the discussions, expressed understanding, and voiced agreement with the above plans.  All questions were answered to his satisfaction. he is encouraged to contact clinic should he have any questions or concerns prior to his return visit.   Follow up plan: Return in about 6 months (around 03/13/2020) for Follow up with Pre-visit Labs.   Glade Lloyd, MD [facility] [address] [place], [place] [address] Phone: [phone]  Fax: [phone]     09/14/2019, 5:16 PM  This note was partially dictated with voice recognition software. Similar sounding words can be transcribed inadequately or may not  be corrected upon review.

## 2019-12-10 ENCOUNTER — Other Ambulatory Visit: Payer: Self-pay | Admitting: "Endocrinology

## 2020-02-24 DIAGNOSIS — C73 Malignant neoplasm of thyroid gland: Secondary | ICD-10-CM | POA: Diagnosis not present

## 2020-02-24 DIAGNOSIS — E890 Postprocedural hypothyroidism: Secondary | ICD-10-CM | POA: Diagnosis not present

## 2020-02-24 DIAGNOSIS — R945 Abnormal results of liver function studies: Secondary | ICD-10-CM | POA: Diagnosis not present

## 2020-02-24 DIAGNOSIS — I5032 Chronic diastolic (congestive) heart failure: Secondary | ICD-10-CM | POA: Diagnosis not present

## 2020-02-24 DIAGNOSIS — I10 Essential (primary) hypertension: Secondary | ICD-10-CM | POA: Diagnosis not present

## 2020-02-24 LAB — VITAMIN D 25 HYDROXY (VIT D DEFICIENCY, FRACTURES): Vit D, 25-Hydroxy: 16.8

## 2020-02-24 LAB — COMPREHENSIVE METABOLIC PANEL
Calcium: 8.6 — AB (ref 8.7–10.7)
GFR calc Af Amer: 80
GFR calc non Af Amer: 69

## 2020-02-24 LAB — BASIC METABOLIC PANEL
BUN: 23 — AB (ref 4–21)
CO2: 24 — AB (ref 13–22)
Chloride: 103 (ref 99–108)
Creatinine: 1.2 (ref 0.6–1.3)
Potassium: 4.5 (ref 3.4–5.3)
Sodium: 143 (ref 137–147)

## 2020-02-24 LAB — TSH: TSH: 1.41 (ref 0.41–5.90)

## 2020-02-28 DIAGNOSIS — I517 Cardiomegaly: Secondary | ICD-10-CM | POA: Diagnosis not present

## 2020-02-28 DIAGNOSIS — E890 Postprocedural hypothyroidism: Secondary | ICD-10-CM | POA: Diagnosis not present

## 2020-02-28 DIAGNOSIS — C73 Malignant neoplasm of thyroid gland: Secondary | ICD-10-CM | POA: Diagnosis not present

## 2020-03-09 ENCOUNTER — Other Ambulatory Visit: Payer: Self-pay | Admitting: "Endocrinology

## 2020-03-13 ENCOUNTER — Ambulatory Visit: Payer: BC Managed Care – PPO | Admitting: "Endocrinology

## 2020-03-13 ENCOUNTER — Ambulatory Visit (INDEPENDENT_AMBULATORY_CARE_PROVIDER_SITE_OTHER): Payer: BC Managed Care – PPO | Admitting: "Endocrinology

## 2020-03-13 ENCOUNTER — Other Ambulatory Visit: Payer: Self-pay

## 2020-03-13 ENCOUNTER — Encounter: Payer: Self-pay | Admitting: "Endocrinology

## 2020-03-13 VITALS — BP 120/86 | HR 72 | Ht 68.0 in | Wt 279.8 lb

## 2020-03-13 DIAGNOSIS — E890 Postprocedural hypothyroidism: Secondary | ICD-10-CM | POA: Diagnosis not present

## 2020-03-13 DIAGNOSIS — E559 Vitamin D deficiency, unspecified: Secondary | ICD-10-CM | POA: Diagnosis not present

## 2020-03-13 DIAGNOSIS — Z85850 Personal history of malignant neoplasm of thyroid: Secondary | ICD-10-CM

## 2020-03-13 MED ORDER — CALCIUM CARBONATE 1250 (500 CA) MG PO CHEW: 1.0000 | CHEWABLE_TABLET | Freq: Two times a day (BID) | ORAL | 1 refills | Status: DC

## 2020-03-13 MED ORDER — VITAMIN D3 125 MCG (5000 UT) PO CAPS: 5000.0000 [IU] | ORAL_CAPSULE | Freq: Every day | ORAL | 1 refills | Status: AC

## 2020-03-13 MED ORDER — LEVOTHYROXINE SODIUM 175 MCG PO TABS: ORAL_TABLET | ORAL | 1 refills | Status: DC

## 2020-03-13 NOTE — Progress Notes (Signed)
03/13/2020, 5:57 PM              Endocrinology follow-up note   Subjective:    Patient ID: Carlos Payne, male    DOB: 1961-07-13, PCP Burdine, Virgina Evener, MD   Past Medical History:  Diagnosis Date  . Cancer [facility])    thyroid cancer  . Dyspnea   . GERD (gastroesophageal reflux disease)   . Hypertension   . Needle phobia    severe, needs to lie down for sticks  . Seizures ([place])    as a child  . Thyroid mass    left  . Wears hearing aid in both ears    Past Surgical History:  Procedure Laterality Date  . EYE SURGERY     foreign object removed  . FINGER SURGERY Right    index  . HERNIA REPAIR     age 51  . MULTIPLE TOOTH EXTRACTIONS    . THYROIDECTOMY Left 08/24/2017   Procedure: LEFT HEMI THYROIDECTOMY;  Surgeon: Leta Baptist, MD;  Location: [facility];  Service: ENT;  Laterality: Left;  . THYROIDECTOMY N/A 09/04/2017   Procedure: COMPLETION THYROIDECTOMY;  Surgeon: Leta Baptist, MD;  Location[facility];  Service: ENT;  Laterality: N/A;   Social History   Socioeconomic History  . Marital status: Married    Spouse name: Not on file  . Number of children: Not on file  . Years of education: Not on file  . Highest education level: Not on file  Occupational History  . Not on file  Tobacco Use  . Smoking status: Never Smoker  . Smokeless tobacco: Never Used  Vaping Use  . Vaping Use: Never used  Substance and Sexual Activity  . Alcohol use: No  . Drug use: No  . Sexual activity: Not on file  Other Topics Concern  . Not on file  Social History Narrative  . Not on file   Social Determinants of Health   Financial Resource Strain:   . Difficulty of Paying Living Expenses:   Food Insecurity:   . Worried About Charity fundraiser in the Last Year:   . Arboriculturist in the Last Year:   Transportation Needs:   . Film/video editor (Medical):   Marland Kitchen Lack of Transportation (Non-Medical):   Physical  Activity:   . Days of Exercise per Week:   . Minutes of Exercise per Session:   Stress:   . Feeling of Stress :   Social Connections:   . Frequency of Communication with Friends and Family:   . Frequency of Social Gatherings with Friends and Family:   . Attends Religious Services:   . Active Member of Clubs or Organizations:   . Attends Archivist Meetings:   Marland Kitchen Marital Status:    Outpatient Encounter Medications as of 03/13/2020  Medication Sig  . acetaminophen (TYLENOL) 500 MG tablet Take 1,000 mg by mouth every 8 (eight) hours as needed for moderate pain.  . calcium carbonate (OS-CAL) 1250 (500 Ca) MG chewable tablet Chew 1 tablet (1,250 mg total) by mouth 2 (two) times daily.  . Cholecalciferol (VITAMIN D3) 125 MCG (5000 UT) CAPS Take 1 capsule (5,000 Units total) by mouth  daily.  . hydrochlorothiazide (HYDRODIURIL) 12.5 MG tablet daily.  Marland Kitchen levothyroxine (SYNTHROID) 175 MCG tablet TAKE 1 TABLET BY MOUTH DAILY BEFORE BREAKFAST.  Marland Kitchen losartan (COZAAR) 50 MG tablet daily.  . [DISCONTINUED] calcium carbonate (OS-CAL) 1250 (500 Ca) MG chewable tablet Chew 1 tablet (1,250 mg total) by mouth daily.  . [DISCONTINUED] docusate sodium (COLACE) 50 MG capsule Take 50 mg by mouth 2 (two) times daily.  . [DISCONTINUED] levothyroxine (SYNTHROID) 175 MCG tablet TAKE 1 TABLET BY MOUTH DAILY BEFORE BREAKFAST.  . [DISCONTINUED] lisinopril-hydrochlorothiazide (PRINZIDE,ZESTORETIC) 20-25 MG tablet Take 1 tablet by mouth daily.  . [DISCONTINUED] oxyCODONE-acetaminophen (PERCOCET/ROXICET) 5-325 MG tablet Take 1-2 tablets by mouth every 4 (four) hours as needed for severe pain.   No facility-administered encounter medications on file as of 03/13/2020.   ALLERGIES: No Known Allergies  VACCINATION STATUS:  There is no immunization history on file for this patient.  HPI Carlos Payne is 59 y.o. male who presents today with a medical history as above. he is being seen in follow-up for thyroid cancer  status post near total thyroidectomy and I-131 thyroid remnant ablation.   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  Completion surgery on September 04, 2017 with right lobe thyroidectomy showed benign thyroid parenchyma, benign parathyroid gland, with no evidence of malignancy.  -After his last visit with [place] here, he was scheduled to receive Thyrogen stimulated thyroid remnant ablation with I-131 which was delivered on October 22, 2017 followed by whole body scan on November 01, 2017.  His whole body scan was negative for distant metastasis.  His previsit thyroid/neck ultrasound is negative for evidence of mass lesions.  -His previsit thyroid and stimulated whole-body scan was negative for actually evidence of relapse or recurrence. -He is calcium carbonate 1250 levothyroxine 175 mcg p.o. before breakfast.  He reports compliance with medication.  His previsit labs show mild hypocalcemia at 8.6 currently on calcium carbonate 1250 mg x 1 daily.    His previsit labs also show vitamin D deficiency.   Previously, his labs showed low PTH of 12.  -He denies exposure to neck radiation. -He reports 20  pound weight loss since last  Year.   -He denies new complaints.  -He denies dysphagia, odynophagia, change in his voice.  Review of Systems  Limited as above.  Objective:    BP 120/86   Pulse 72   Ht 5\' 8"  (1.727 m)   Wt 279 lb 12.8 oz (126.9 kg)   BMI 42.54 kg/m   Wt Readings from Last 3 Encounters:  03/13/20 279 lb 12.8 oz (126.9 kg)  03/01/19 273 lb (123.8 kg)  08/31/18 284 lb 12.8 oz (129.2 kg)    Physical Exam   CMP     Component Value Date/Time   NA 143 02/24/2020 0000   K 4.5 02/24/2020 0000   CL 103 02/24/2020 0000   CO2 24 (A) 02/24/2020 0000   GLUCOSE 116 (H) 08/22/2018 1552   BUN 23 (A) 02/24/2020 0000   CREATININE 1.2 02/24/2020 0000    CREATININE 1.03 08/22/2018 1552   CALCIUM 8.6 (A) 02/24/2020 0000   CALCIUM 9.2 05/25/2018 1630   PROT 7.5 08/22/2018 1552   ALBUMIN 4.3 08/22/2018 1552   AST 45 (H) 08/22/2018 1552   ALT 80 (H) 08/22/2018 1552   ALKPHOS 55 08/22/2018 1552   BILITOT 0.8 08/22/2018 1552   GFRNONAA  69 02/24/2020 0000   GFRAA 80 02/24/2020 0000    Lab Results  Component Value Date   TSH 1.41 02/24/2020   TSH 0.204 (L) 08/22/2018   TSH 2.010 05/25/2018   TSH 1.309 11/01/2017   TSH 7.37 (A) 10/08/2017   FREET4 1.27 (H) 08/18/2019   FREET4 1.00 08/22/2018   FREET4 0.99 05/25/2018   FREET4 1.08 11/01/2017       October 08, 2017 lab work from outside facility showed calcium 9.3, TSH elevated at 7.3 7, free T4 low normal at 0.93     Review of his surgical pathology from August 24, 2017 showed 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  Tumor capsule was focally invaded by tumor, no extrathyroidal extension, margin free of tumor, lymphatic/vascular invasion present, no lymph nodes were examined.  TNM code p T3a, pNX .  -Thyrogen stimulated remnant ablation  was administered on October 22, 2017 followed by a negative whole-body scan on November 01, 2017.  Ultrasound of neck on August 19, 2018 is negative for tumor recurrence.   Thyrogen stimulated whole-body scan on August 21, 2019  FINDINGS: Physiologic activity within the nasopharynx, liver and bladder. There is no abnormal activity within the neck or elsewhere to suggest local recurrence or metastatic disease.  IMPRESSION: Normal examination post thyroidectomy and thyroid ablation. No evidence of local recurrence or metastatic disease.  Assessment & Plan:   1. Thyroid cancer (HCC)-Hurthle cell carcinoma 2. Postsurgical hypothyroidism 3.  Postsurgical hypocalcemia  -He has had a stage 3 Hurthle cell carcinoma status post total thyroidectomy on 2 stages.  Left  hemithyroidectomy on August 24, 2017 and completion right hemithyroidectomy on September 04, 2017 with no evidence of malignancy on the right side. 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 -Status post Thyrogen stimulated thyroid remnant ablation with negative whole-body scan.  -His thyroglobulin level is 0.3 associated with undetectable antithyroglobulin antibodies. -This completes his initial treatment for thyroid cancer.  -Risk of tumor recurrence in the next 5 - 10 years, and the need for consistent follow-up with periodic imaging studies is emphasized to the patient and his wife in exam room.    -His thyroid/neck ultrasound on August 19, 2018 is negative.  -His previsit Thyrogen stimulated whole-body scan -normal exam post thyroidectomy and thyroid ablation.  No evidence of local recurrence or metastatic disease.   -Would be considered for surveillance thyroid/neck ultrasound before his next visit in 6 months.  -For his postsurgical hypothyroidism: -His previsit thyroid function tests are consistent with appropriate replacement, will be continued on levothyroxine 175 mcg daily before breakfast.  Target TSH for him would be between 0.1-0.5 for up to 5 years.    - We discussed about the correct intake of his thyroid hormone, on empty stomach at fasting, with water, separated by at least 30 minutes from breakfast and other medications,  and separated by more than 4 hours from calcium, iron, multivitamins, acid reflux medications (PPIs). -Patient is made aware of the fact that thyroid hormone replacement is needed for life, dose to be adjusted by periodic monitoring of thyroid function tests.   -His repeat labs show calcium of 8.6 dropping from 9.9.  He is advised to continue increase his calcium supplemen with Tums, 1 tablet twice a day with meals.    -Regarding his vitamin D  deficiency: I discussed initiated vitamin D3 5000 units daily until next measurement in 6 months.   He will have a repeat CMP along with his next blood work in 6 months.   - I advised patient to maintain close follow up with Burdine, Virgina Evener, MD for primary care needs.      - Time spent on this patient care encounter:  20 minutes of which 50% was spent in  counseling and the rest reviewing  his current and  previous labs / studies and medications  doses and developing a plan for long term care. Anise Salvo  participated in the discussions, expressed understanding, and voiced agreement with the above plans.  All questions were answered to his satisfaction. he is encouraged to contact clinic should he have any questions or concerns prior to his return visit.   Follow up plan: Return in about 6 months (around 09/13/2020) for F/U with Pre-visit Labs, Thyroid / Neck Ultrasound.   Glade Lloyd, MD [facility] [address] [place], [place] [address] Phone: [phone]  Fax: [phone]     03/13/2020, 5:57 PM  This note was partially dictated with voice recognition software. Similar sounding words can be transcribed inadequately or may not  be corrected upon review.

## 2020-08-28 ENCOUNTER — Other Ambulatory Visit: Payer: Self-pay

## 2020-08-28 ENCOUNTER — Ambulatory Visit (HOSPITAL_COMMUNITY)
Admission: RE | Admit: 2020-08-28 | Discharge: 2020-08-28 | Disposition: A | Discharge status: Home / Self Care | Payer: BC Managed Care – PPO | Source: Ambulatory Visit | Attending: "Endocrinology | Admitting: "Endocrinology

## 2020-08-28 DIAGNOSIS — Z85850 Personal history of malignant neoplasm of thyroid: Secondary | ICD-10-CM | POA: Diagnosis not present

## 2020-08-31 LAB — VITAMIN D 25 HYDROXY (VIT D DEFICIENCY, FRACTURES): Vit D, 25-Hydroxy: 40.8

## 2020-08-31 LAB — LIPID PANEL
Cholesterol: 197 (ref 0–200)
HDL: 50 (ref 35–70)
LDL Cholesterol: 116
Triglycerides: 174 — AB (ref 40–160)

## 2020-08-31 LAB — COMPREHENSIVE METABOLIC PANEL
Calcium: 9.1 (ref 8.7–10.7)
GFR calc Af Amer: 76
GFR calc non Af Amer: 66

## 2020-08-31 LAB — BASIC METABOLIC PANEL
BUN: 18 (ref 4–21)
Creatinine: 1.2 (ref 0.6–1.3)

## 2020-08-31 LAB — TSH: TSH: 1.82 (ref 0.41–5.90)

## 2020-09-16 ENCOUNTER — Other Ambulatory Visit: Payer: Self-pay

## 2020-09-16 ENCOUNTER — Encounter: Payer: Self-pay | Admitting: "Endocrinology

## 2020-09-16 ENCOUNTER — Ambulatory Visit (INDEPENDENT_AMBULATORY_CARE_PROVIDER_SITE_OTHER): Payer: BC Managed Care – PPO | Admitting: "Endocrinology

## 2020-09-16 VITALS — BP 148/96 | HR 80 | Ht 68.0 in | Wt 287.6 lb

## 2020-09-16 DIAGNOSIS — Z85850 Personal history of malignant neoplasm of thyroid: Secondary | ICD-10-CM

## 2020-09-16 DIAGNOSIS — E890 Postprocedural hypothyroidism: Secondary | ICD-10-CM | POA: Diagnosis not present

## 2020-09-16 MED ORDER — LEVOTHYROXINE SODIUM 175 MCG PO TABS: ORAL_TABLET | ORAL | 1 refills | Status: DC

## 2020-09-16 MED ORDER — CALCIUM CARBONATE 1250 (500 CA) MG PO CHEW: 1.0000 | CHEWABLE_TABLET | Freq: Every day | ORAL | 1 refills | Status: DC

## 2020-09-16 NOTE — Progress Notes (Signed)
09/16/2020, 3:42 PM              Endocrinology follow-up note   Subjective:    Patient ID: Carlos Payne, male    DOB: 1961/04/10, PCP Burdine, Virgina Evener, MD   Past Medical History:  Diagnosis Date  . Cancer [facility])    thyroid cancer  . Dyspnea   . GERD (gastroesophageal reflux disease)   . Hypertension   . Needle phobia    severe, needs to lie down for sticks  . Seizures ([place])    as a child  . Thyroid mass    left  . Wears hearing aid in both ears    Past Surgical History:  Procedure Laterality Date  . EYE SURGERY     foreign object removed  . FINGER SURGERY Right    index  . HERNIA REPAIR     age 18  . MULTIPLE TOOTH EXTRACTIONS    . THYROIDECTOMY Left 08/24/2017   Procedure: LEFT HEMI THYROIDECTOMY;  Surgeon: Leta Baptist, MD;  Location: [place];  Service: ENT;  Laterality: Left;  . THYROIDECTOMY N/A 09/04/2017   Procedure: COMPLETION THYROIDECTOMY;  Surgeon: Leta Baptist, MD;  Location[facility];  Service: ENT;  Laterality: N/A;   Social History   Socioeconomic History  . Marital status: Married    Spouse name: Not on file  . Number of children: Not on file  . Years of education: Not on file  . Highest education level: Not on file  Occupational History  . Not on file  Tobacco Use  . Smoking status: Never Smoker  . Smokeless tobacco: Never Used  Vaping Use  . Vaping Use: Never used  Substance and Sexual Activity  . Alcohol use: No  . Drug use: No  . Sexual activity: Not on file  Other Topics Concern  . Not on file  Social History Narrative  . Not on file   Social Determinants of Health   Financial Resource Strain: Not on file  Food Insecurity: Not on file  Transportation Needs: Not on file  Physical Activity: Not on file  Stress: Not on file  Social Connections: Not on file   Outpatient Encounter Medications as of 09/16/2020  Medication Sig  . acetaminophen (TYLENOL) 500 MG tablet Take  1,000 mg by mouth every 8 (eight) hours as needed for moderate pain.  . calcium carbonate (OS-CAL) 1250 (500 Ca) MG chewable tablet Chew 1 tablet (1,250 mg total) by mouth daily with breakfast.  . Cholecalciferol (VITAMIN D3) 125 MCG (5000 UT) CAPS Take 1 capsule (5,000 Units total) by mouth daily.  . hydrochlorothiazide (HYDRODIURIL) 12.5 MG tablet daily.  Marland Kitchen levothyroxine (SYNTHROID) 175 MCG tablet TAKE 1 TABLET BY MOUTH DAILY BEFORE BREAKFAST.  Marland Kitchen losartan (COZAAR) 50 MG tablet daily.  . [DISCONTINUED] calcium carbonate (OS-CAL) 1250 (500 Ca) MG chewable tablet Chew 1 tablet (1,250 mg total) by mouth 2 (two) times daily.  . [DISCONTINUED] levothyroxine (SYNTHROID) 175 MCG tablet TAKE 1 TABLET BY MOUTH DAILY BEFORE BREAKFAST.   No facility-administered encounter medications on file as of 09/16/2020.   ALLERGIES: No Known Allergies  VACCINATION STATUS:  There is no immunization history on file for this patient.  HPI Carlos Payne  is 60 y.o. male who presents today with a medical history as above. he is being seen in follow-up for thyroid cancer status post near total thyroidectomy and I-131 thyroid remnant ablation.   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  Completion surgery on September 04, 2017 with right lobe thyroidectomy showed benign thyroid parenchyma, benign parathyroid gland, with no evidence of malignancy.  -After his last visit with [place] here, he was scheduled to receive Thyrogen stimulated thyroid remnant ablation with I-131 which was delivered on October 22, 2017 followed by whole body scan on November 01, 2017.  His whole body scan was negative for distant metastasis.  Subsequent thyroid/neck ultrasound has been negative for any evidence of residual or recurrent disease.   -Prior to his last visit, the second stimulated whole-body scan was negative for  actually evidence of relapse or recurrence.  -Before this current visit thyroid/neck ultrasound was also negative for residual disease. -He just ran out of his calcium supplements.  He remains on levothyroxine 175 mcg p.o. daily before breakfast.    His previsit labs also show vitamin D deficiency.   Previously, his labs showed low PTH of 12.  -He denies exposure to neck radiation. -He reports 20  pound weight loss since last  Year.   -He denies new complaints.  -He denies dysphagia, odynophagia, change in his voice.  Review of Systems  Limited as above.  Objective:    BP (!) 148/96   Pulse 80   Ht 5\' 8"  (1.727 m)   Wt 287 lb 9.6 oz (130.5 kg)   BMI 43.73 kg/m   Wt Readings from Last 3 Encounters:  09/16/20 287 lb 9.6 oz (130.5 kg)  03/13/20 279 lb 12.8 oz (126.9 kg)  03/01/19 273 lb (123.8 kg)    Physical Exam   CMP     Component Value Date/Time   NA 143 02/24/2020 0000   K 4.5 02/24/2020 0000   CL 103 02/24/2020 0000   CO2 24 (A) 02/24/2020 0000   GLUCOSE 116 (H) 08/22/2018 1552   BUN 18 08/31/2020 0000   CREATININE 1.2 08/31/2020 0000   CREATININE 1.03 08/22/2018 1552   CALCIUM 9.1 08/31/2020 0000   CALCIUM 9.2 05/25/2018 1630   PROT 7.5 08/22/2018 1552   ALBUMIN 4.3 08/22/2018 1552   AST 45 (H) 08/22/2018 1552   ALT 80 (H) 08/22/2018 1552   ALKPHOS 55 08/22/2018 1552   BILITOT 0.8 08/22/2018 1552   GFRNONAA 66 08/31/2020 0000   GFRAA 76 08/31/2020 0000    Lab Results  Component Value Date   TSH 1.82 08/31/2020   TSH 1.41 02/24/2020   TSH 0.204 (L) 08/22/2018   TSH 2.010 05/25/2018   TSH 1.309 11/01/2017   TSH 7.37 (A) 10/08/2017   FREET4 1.27 (H) 08/18/2019   FREET4 1.00 08/22/2018   FREET4 0.99 05/25/2018   FREET4 1.08 11/01/2017       October 08, 2017 lab work from outside facility showed calcium 9.3, TSH elevated at 7.3 7, free T4 low normal at 0.93     Review of his surgical pathology from August 24, 2017 showed 6.2 cm Hurthle cell  carcinoma from the left lobe of his thyroid which measured 7.5 x 4.8 x 4.2 cm in gross specimen.  Tumor capsule was focally invaded by tumor, no extrathyroidal extension, margin free of tumor, lymphatic/vascular invasion present, no  lymph nodes were examined.  TNM code p T3a, pNX .  -Thyrogen stimulated remnant ablation  was administered on October 22, 2017 followed by a negative whole-body scan on November 01, 2017.  Ultrasound of neck on August 19, 2018 is negative for tumor recurrence.   Thyrogen stimulated whole-body scan on August 21, 2019  FINDINGS: Physiologic activity within the nasopharynx, liver and bladder. There is no abnormal activity within the neck or elsewhere to suggest local recurrence or metastatic disease.  IMPRESSION: Normal examination post thyroidectomy and thyroid ablation. No evidence of local recurrence or metastatic disease.   Thyroid/neck ultrasound on August 28, 2020 No regional cervical lymphadenopathy.  IMPRESSION: Post total thyroidectomy without evidence of residual or locally recurrent disease.  Assessment & Plan:   1. Thyroid cancer (HCC)-Hurthle cell carcinoma 2. Postsurgical hypothyroidism 3.  Postsurgical hypocalcemia  -He has had a stage 3 Hurthle cell carcinoma status post total thyroidectomy on 2 stages.  Left hemithyroidectomy on August 24, 2017 and completion right hemithyroidectomy on September 04, 2017 with no evidence of malignancy on the right side. 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 -Status post Thyrogen stimulated thyroid remnant ablation with negative whole-body scan.  -His thyroglobulin level is 0.3 associated with undetectable antithyroglobulin antibodies. -This completes his initial treatment for thyroid cancer.  -Risk of tumor recurrence in the next 5 - 10 years, and the need for consistent follow-up with  periodic imaging studies is emphasized to the patient and his wife in exam room.    -His thyroid/neck ultrasound on August 19, 2018 is negative.  -His previsit Thyrogen stimulated whole-body scan -normal exam post thyroidectomy and thyroid ablation.  No evidence of local recurrence or metastatic disease.   -His last surveillance thyroid/neck ultrasound is negative for residual or recurrent disease.   -He will be considered for 1 more Thyrogen stimulated whole-body scan after a year.  -For his postsurgical hypothyroidism: -His previsit thyroid function tests are consistent with appropriate replacement, advised to continue.  His current dose of levothyroxine 175 mg p.o. daily before breakfast. Target TSH for him would be between 0.1-0.5 for up to 5 years.   - We discussed about the correct intake of his thyroid hormone, on empty stomach at fasting, with water, separated by at least 30 minutes from breakfast and other medications,  and separated by more than 4 hours from calcium, iron, multivitamins, acid reflux medications (PPIs). -Patient is made aware of the fact that thyroid hormone replacement is needed for life, dose to be adjusted by periodic monitoring of thyroid function tests.  -His recent CMP showed calcium 9.1 while he was taking calcium supplements.  He is advised to continue calcium carbonate 1250 mg - 500 mg elemental calcium p.o. daily at breakfast.    -Regarding his vitamin D deficiency: I discussed initiated vitamin D3 5000 units daily.   He will have a repeat CMP along with his next blood work in 6 months.   - I advised patient to maintain close follow up with Burdine, Virgina Evener, MD for primary care needs.     - Time spent on this patient care encounter:  30 minutes of which 50% was spent in  counseling and the rest reviewing  his current and  previous labs / studies and medications  doses and developing a plan for long term care. Carlos Payne  participated in the  discussions, expressed understanding, and voiced agreement  with the above plans.  All questions were answered to his satisfaction. he is encouraged to contact clinic should he have any questions or concerns prior to his return visit.   Follow up plan: Return in about 6 months (around 03/16/2021) for F/U with Pre-visit Labs.   Glade Lloyd, MD [facility] [address] [place], [place] [address] Phone: [phone]  Fax: [phone]     09/16/2020, 3:42 PM  This note was partially dictated with voice recognition software. Similar sounding words can be transcribed inadequately or may not  be corrected upon review.

## 2021-03-22 LAB — BASIC METABOLIC PANEL
BUN: 21 (ref 4–21)
CO2: 25 — AB (ref 13–22)
Chloride: 101 (ref 99–108)
Creatinine: 1 (ref 0.6–1.3)
Glucose: 103
Potassium: 4.5 (ref 3.4–5.3)
Sodium: 139 (ref 137–147)

## 2021-03-22 LAB — HEPATIC FUNCTION PANEL
ALT: 35 (ref 10–40)
AST: 35 (ref 14–40)
Alkaline Phosphatase: 64 (ref 25–125)
Bilirubin, Total: 0.6

## 2021-03-22 LAB — COMPREHENSIVE METABOLIC PANEL
Albumin: 4.4 (ref 3.5–5.0)
Calcium: 9 (ref 8.7–10.7)
Globulin: 2.4

## 2021-03-22 LAB — LIPID PANEL
Cholesterol: 172 (ref 0–200)
HDL: 38 (ref 35–70)
LDL Cholesterol: 104
Triglycerides: 172 — AB (ref 40–160)

## 2021-03-22 LAB — TSH: TSH: 2.49 (ref 0.41–5.90)

## 2021-03-26 ENCOUNTER — Encounter: Payer: Self-pay | Admitting: "Endocrinology

## 2021-03-26 ENCOUNTER — Ambulatory Visit (INDEPENDENT_AMBULATORY_CARE_PROVIDER_SITE_OTHER): Payer: BC Managed Care – PPO | Admitting: "Endocrinology

## 2021-03-26 ENCOUNTER — Other Ambulatory Visit: Payer: Self-pay

## 2021-03-26 VITALS — BP 160/108 | HR 80 | Ht 68.0 in | Wt 281.6 lb

## 2021-03-26 DIAGNOSIS — Z85850 Personal history of malignant neoplasm of thyroid: Secondary | ICD-10-CM | POA: Diagnosis not present

## 2021-03-26 DIAGNOSIS — E559 Vitamin D deficiency, unspecified: Secondary | ICD-10-CM | POA: Diagnosis not present

## 2021-03-26 DIAGNOSIS — E890 Postprocedural hypothyroidism: Secondary | ICD-10-CM | POA: Diagnosis not present

## 2021-03-26 MED ORDER — HYDROCHLOROTHIAZIDE 25 MG PO TABS: 25.0000 mg | ORAL_TABLET | Freq: Every day | ORAL | 1 refills | Status: AC

## 2021-03-26 NOTE — Progress Notes (Signed)
03/26/2021, 5:31 PM              Endocrinology follow-up note   Subjective:    Patient ID: Carlos Payne, male    DOB: 1960-11-22, PCP Burdine, Virgina Evener, MD   Past Medical History:  Diagnosis Date   Cancer [facility])    thyroid cancer   Dyspnea    GERD (gastroesophageal reflux disease)    Hypertension    Needle phobia    severe, needs to lie down for sticks   Seizures ([place])    as a child   Thyroid mass    left   Wears hearing aid in both ears    Past Surgical History:  Procedure Laterality Date   EYE SURGERY     foreign object removed   FINGER SURGERY Right    index   HERNIA REPAIR     age 39   MULTIPLE TOOTH EXTRACTIONS     THYROIDECTOMY Left 08/24/2017   Procedure: LEFT HEMI THYROIDECTOMY;  Surgeon: Leta Baptist, MD;  Location: [place];  Service: ENT;  Laterality: Left;   THYROIDECTOMY N/A 09/04/2017   Procedure: COMPLETION THYROIDECTOMY;  Surgeon: Leta Baptist, MD;  Location[facility];  Service: ENT;  Laterality: N/A;   Social History   Socioeconomic History   Marital status: Married    Spouse name: Not on file   Number of children: Not on file   Years of education: Not on file   Highest education level: Not on file  Occupational History   Not on file  Tobacco Use   Smoking status: Never   Smokeless tobacco: Never  Vaping Use   Vaping Use: Never used  Substance and Sexual Activity   Alcohol use: No   Drug use: No   Sexual activity: Not on file  Other Topics Concern   Not on file  Social History Narrative   Not on file   Social Determinants of Health   Financial Resource Strain: Not on file  Food Insecurity: Not on file  Transportation Needs: Not on file  Physical Activity: Not on file  Stress: Not on file  Social Connections: Not on file   Outpatient Encounter Medications as of 03/26/2021  Medication Sig   cetirizine (ZYRTEC) 10 MG tablet Take 10 mg by mouth daily.   acetaminophen  (TYLENOL) 500 MG tablet Take 1,000 mg by mouth every 8 (eight) hours as needed for moderate pain.   calcium carbonate (OS-CAL) 1250 (500 Ca) MG chewable tablet Chew 1 tablet (1,250 mg total) by mouth daily with breakfast.   Cholecalciferol (VITAMIN D3) 125 MCG (5000 UT) CAPS Take 1 capsule (5,000 Units total) by mouth daily.   hydrochlorothiazide (HYDRODIURIL) 25 MG tablet Take 1 tablet (25 mg total) by mouth daily.   levothyroxine (SYNTHROID) 175 MCG tablet TAKE 1 TABLET BY MOUTH DAILY BEFORE BREAKFAST.   losartan (COZAAR) 50 MG tablet daily.   [DISCONTINUED] hydrochlorothiazide (HYDRODIURIL) 12.5 MG tablet daily.   No facility-administered encounter medications on file as of 03/26/2021.   ALLERGIES: No Known Allergies  VACCINATION STATUS:  There is no immunization history on file for this patient.  HPI Carlos Payne is 60 y.o. male who presents today with a medical history as  above. he is being seen in follow-up for thyroid cancer status post near total thyroidectomy and I-131 thyroid remnant ablation.   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  Completion surgery on September 04, 2017 with right lobe thyroidectomy showed benign thyroid parenchyma, benign parathyroid gland, with no evidence of malignancy.  -After his last visit with [place] here, he was scheduled to receive Thyrogen stimulated thyroid remnant ablation with I-131 which was delivered on October 22, 2017 followed by whole body scan on November 01, 2017.  His whole body scan was negative for distant metastasis.  Subsequent thyroid/neck ultrasound has been negative for any evidence of residual or recurrent disease.   -Prior to his last visit, the second stimulated whole-body scan was negative for actually evidence of relapse or recurrence.  -Before his last visit  thyroid/neck ultrasound was also negative for  residual disease. -He remains on low-dose calcium supplement, and Synthroid 175 mcg p.o. daily before breakfast.  He reports compliance and consistency in taking his medications.  He has no new complaints today.     His previsit labs also show vitamin D deficiency.   Previously, his labs showed low PTH of 12.  -He denies exposure to neck radiation. -He reports 20  pound weight loss since last  Year.   -He denies new complaints.  -He denies dysphagia, odynophagia, change in his voice.  Review of Systems  Limited as above.  Objective:    BP (!) 160/108   Pulse 80   Ht '5\' 8"'$  (1.727 m)   Wt 281 lb 9.6 oz (127.7 kg)   BMI 42.82 kg/m   Wt Readings from Last 3 Encounters:  03/26/21 281 lb 9.6 oz (127.7 kg)  09/16/20 287 lb 9.6 oz (130.5 kg)  03/13/20 279 lb 12.8 oz (126.9 kg)    Physical Exam   CMP     Component Value Date/Time   NA 139 03/22/2021 0000   K 4.5 03/22/2021 0000   CL 101 03/22/2021 0000   CO2 25 (A) 03/22/2021 0000   GLUCOSE 116 (H) 08/22/2018 1552   BUN 21 03/22/2021 0000   CREATININE 1.0 03/22/2021 0000   CREATININE 1.03 08/22/2018 1552   CALCIUM 9.0 03/22/2021 0000   CALCIUM 9.2 05/25/2018 1630   PROT 7.5 08/22/2018 1552   ALBUMIN 4.4 03/22/2021 0000   AST 35 03/22/2021 0000   ALT 35 03/22/2021 0000   ALKPHOS 64 03/22/2021 0000   BILITOT 0.8 08/22/2018 1552   GFRNONAA 66 08/30/2020 0000   GFRAA 76 08/30/2020 0000    Lab Results  Component Value Date   TSH 2.49 03/22/2021   TSH 1.82 08/30/2020   TSH 1.41 02/24/2020   TSH 0.204 (L) 08/22/2018   TSH 2.010 05/25/2018   TSH 1.309 11/01/2017   TSH 7.37 (A) 10/08/2017   FREET4 1.27 (H) 08/18/2019   FREET4 1.00 08/22/2018   FREET4 0.99 05/25/2018   FREET4 1.08 11/01/2017       October 08, 2017 lab work from outside facility showed calcium 9.3, TSH elevated at 7.3 7, free T4 low normal at 0.93     Review of his surgical pathology from August 24, 2017 showed 6.2 cm Hurthle cell carcinoma from  the left lobe of his thyroid which measured 7.5 x 4.8 x 4.2 cm in gross specimen.  Tumor capsule was focally invaded by tumor, no extrathyroidal extension, margin  free of tumor, lymphatic/vascular invasion present, no lymph nodes were examined.  TNM code p T3a, pNX .  -Thyrogen stimulated remnant ablation  was administered on October 22, 2017 followed by a negative whole-body scan on November 01, 2017.  Ultrasound of neck on August 19, 2018 is negative for tumor recurrence.   Thyrogen stimulated whole-body scan on August 21, 2019  FINDINGS: Physiologic activity within the nasopharynx, liver and bladder. There is no abnormal activity within the neck or elsewhere to suggest local recurrence or metastatic disease.   IMPRESSION: Normal examination post thyroidectomy and thyroid ablation. No evidence of local recurrence or metastatic disease.   Thyroid/neck ultrasound on August 28, 2020 No regional cervical lymphadenopathy.  IMPRESSION: Post total thyroidectomy without evidence of residual or locally recurrent disease.  Assessment & Plan:   1. Thyroid cancer (HCC)-Hurthle cell carcinoma 2. Postsurgical hypothyroidism 3.  Postsurgical hypocalcemia  -He has had a stage 3 Hurthle cell carcinoma status post total thyroidectomy on 2 stages.  Left hemithyroidectomy on August 24, 2017 and completion right hemithyroidectomy on September 04, 2017 with no evidence of malignancy on the right side. 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 -Status post Thyrogen stimulated thyroid remnant ablation with negative whole-body scan.  -His thyroglobulin level is 0.3 associated with undetectable antithyroglobulin antibodies. -This completes his initial treatment for thyroid cancer.  -Risk of tumor recurrence in the next 5 - 10 years, and the need for consistent follow-up with periodic imaging  studies is emphasized to the patient and his wife in exam room.    -His thyroid/neck ultrasound on August 19, 2018 is negative.  -His previsit Thyrogen stimulated whole-body scan -normal exam post thyroidectomy and thyroid ablation.  No evidence of local recurrence or metastatic disease.   -His last surveillance thyroid/neck ultrasound is negative for residual or recurrent disease.   -He will be considered for 1 more Thyrogen stimulated whole-body scan after his next visit .  -For his postsurgical hypothyroidism: -His previsit thyroid function tests are consistent with appropriate replacement, advised to continue.  He is advised to continue Synthroid 175 mcg p.o. daily before breakfast.  . Target TSH for him would be between 0.1-0.5 for up to 5 years.   - We discussed about the correct intake of his thyroid hormone, on empty stomach at fasting, with water, separated by at least 30 minutes from breakfast and other medications,  and separated by more than 4 hours from calcium, iron, multivitamins, acid reflux medications (PPIs). -Patient is made aware of the fact that thyroid hormone replacement is needed for life, dose to be adjusted by periodic monitoring of thyroid function tests.   -His recent CMP showed calcium 9.1 while he was taking calcium supplements.  He is advised to continue calcium carbonate 1250 mg - 500 mg elemental calcium p.o. daily at breakfast.    -Regarding his vitamin D deficiency: I discussed initiated vitamin D3 5000 units daily. Regarding his uncontrolled hypertension, he is advised to increase his hydrochlorothiazide to 25 mg p.o. daily with breakfast, along with his Cozaar 50 mg p.o. daily.   He will have a repeat CMP along with his next blood work in 6 months.   - I advised patient to maintain close follow up with Burdine, Virgina Evener, MD for primary care needs.  I spent 31 minutes in the care of the patient today including review of labs from Thyroid Function, CMP,  and other relevant labs ; imaging/biopsy records (current and previous including abstractions from other facilities); face-to-face time discussing  his lab results and symptoms, medications doses, his options of short and long term treatment based on the latest standards of care / guidelines;   and documenting the encounter.  Anise Salvo  participated in the discussions, expressed understanding, and voiced agreement with the above plans.  All questions were answered to his satisfaction. he is encouraged to contact clinic should he have any questions or concerns prior to his return visit.   Follow up plan: Return in about 6 months (around 09/26/2021) for F/U with Pre-visit Labs.   Glade Lloyd, MD [facility] [address] [place], [place] [address] Phone: [phone]  Fax: [phone]     03/26/2021, 5:31 PM  This note was partially dictated with voice recognition software. Similar sounding words can be transcribed inadequately or may not  be corrected upon review.

## 2021-03-27 ENCOUNTER — Ambulatory Visit: Payer: BC Managed Care – PPO | Admitting: "Endocrinology

## 2021-06-09 IMAGING — NM NM [ID] THYROID CANCER METS WHOLE BODY W/ THYROGEN
3 series · 3 of 3 positions shown · non-contrast
Comparison: Nuclear medicine I 131 scan 11/01/2017. Thyroid
ultrasound 08/19/2018.

CLINICAL DATA: Thyroid cancer post thyroidectomy and radioactive
iodine adjuvant therapy on 10/25/2017.

EXAM:
THYROGEN-STIMULATED T-ARA WHOLE BODY SCAN
TECHNIQUE: The patient received 0.9 mg Thyrogen intramuscularly every 24 hours
for two doses. On the third day the patient returned and received
the radiopharmaceutical, per orally. On the fifth day, the patient
returned and whole body planar images were obtained in the anterior
and posterior projections.
RADIOPHARMACEUTICALS:  4.1 mCi T-ARA sodium iodide orally

[Series 1: i131 whole body · 2.66mm/px · 1 of 1 slices shown]
[im 1/1  full-range]
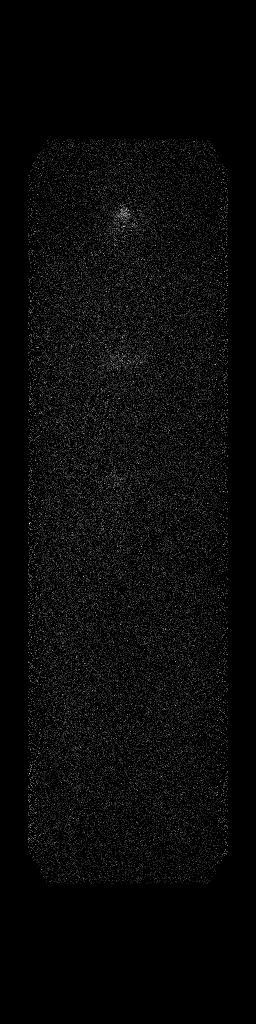

[Series 2: static with marker · 4.14mm/px · 1 of 1 slices shown]
[im 1/1]
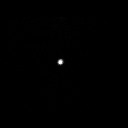

[Series 3: static without marker · non-contrast · 4.14mm/px · 1 of 1 slices shown]
[im 1/1  full-range]
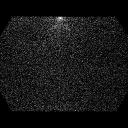

[3 of 3 positions shown; findings below may reference images not displayed]

FINDINGS: Physiologic activity within the nasopharynx, liver and bladder.
There is no abnormal activity within the neck or elsewhere to
suggest local recurrence or metastatic disease.
IMPRESSION: Normal examination post thyroidectomy and thyroid ablation. No
evidence of local recurrence or metastatic disease.

## 2021-07-15 ENCOUNTER — Telehealth: Payer: Self-pay | Admitting: "Endocrinology

## 2021-07-15 NOTE — Telephone Encounter (Signed)
Does pt need whole body scan before next visit or just lab work. At pt's last visit it only states he needs labs prior to visit.

## 2021-07-15 NOTE — Telephone Encounter (Signed)
Patient's wife called and said he is due in January for a whole body scan and she would like to get that set up. Please advise

## 2021-07-16 NOTE — Telephone Encounter (Signed)
Pt's wife made aware, understanding voiced.

## 2021-08-30 ENCOUNTER — Other Ambulatory Visit: Payer: Self-pay | Admitting: "Endocrinology

## 2021-09-01 ENCOUNTER — Other Ambulatory Visit: Payer: Self-pay

## 2021-09-01 MED ORDER — LEVOTHYROXINE SODIUM 175 MCG PO TABS: 175.0000 ug | ORAL_TABLET | Freq: Every day | ORAL | 0 refills | Status: DC

## 2021-09-11 LAB — COLOGUARD: COLOGUARD: NEGATIVE

## 2021-09-13 LAB — LIPID PANEL
Cholesterol: 202 — AB (ref 0–200)
HDL: 39 (ref 35–70)
LDL Cholesterol: 131
Triglycerides: 180 — AB (ref 40–160)

## 2021-09-13 LAB — HEPATIC FUNCTION PANEL
ALT: 47 — AB (ref 10–40)
AST: 33 (ref 14–40)
Alkaline Phosphatase: 67 (ref 25–125)
Bilirubin, Total: 0.7

## 2021-09-13 LAB — COMPREHENSIVE METABOLIC PANEL
Albumin: 4.6 (ref 3.5–5.0)
Calcium: 9.5 (ref 8.7–10.7)
Globulin: 2.8

## 2021-09-13 LAB — BASIC METABOLIC PANEL
BUN: 24 — AB (ref 4–21)
CO2: 25 — AB (ref 13–22)
Chloride: 101 (ref 99–108)
Creatinine: 1.4 — AB (ref 0.6–1.3)
Glucose: 105
Potassium: 4.8 (ref 3.4–5.3)
Sodium: 143 (ref 137–147)

## 2021-09-13 LAB — TSH: TSH: 0.98 (ref 0.41–5.90)

## 2021-09-29 ENCOUNTER — Ambulatory Visit (INDEPENDENT_AMBULATORY_CARE_PROVIDER_SITE_OTHER): Payer: BC Managed Care – PPO | Admitting: "Endocrinology

## 2021-09-29 ENCOUNTER — Other Ambulatory Visit: Payer: Self-pay

## 2021-09-29 ENCOUNTER — Encounter: Payer: Self-pay | Admitting: "Endocrinology

## 2021-09-29 VITALS — BP 148/100 | HR 84 | Ht 68.0 in | Wt 285.4 lb

## 2021-09-29 DIAGNOSIS — Z85850 Personal history of malignant neoplasm of thyroid: Secondary | ICD-10-CM | POA: Diagnosis not present

## 2021-09-29 DIAGNOSIS — E890 Postprocedural hypothyroidism: Secondary | ICD-10-CM

## 2021-09-29 DIAGNOSIS — E782 Mixed hyperlipidemia: Secondary | ICD-10-CM | POA: Diagnosis not present

## 2021-09-29 NOTE — Progress Notes (Signed)
09/29/2021, 6:03 PM              Endocrinology follow-up note   Subjective:    Patient ID: Carlos Payne, male    DOB: Nov 18, 1960, PCP Burdine, Virgina Evener, MD   Past Medical History:  Diagnosis Date   Cancer [facility])    thyroid cancer   Dyspnea    GERD (gastroesophageal reflux disease)    Hypertension    Needle phobia    severe, needs to lie down for sticks   Seizures ([place])    as a child   Thyroid mass    left   Wears hearing aid in both ears    Past Surgical History:  Procedure Laterality Date   EYE SURGERY     foreign object removed   FINGER SURGERY Right    index   HERNIA REPAIR     age 41   MULTIPLE TOOTH EXTRACTIONS     THYROIDECTOMY Left 08/24/2017   Procedure: LEFT HEMI THYROIDECTOMY;  Surgeon: Leta Baptist, MD;  Location: [place];  Service: ENT;  Laterality: Left;   THYROIDECTOMY N/A 09/04/2017   Procedure: COMPLETION THYROIDECTOMY;  Surgeon: Leta Baptist, MD;  Location: [facility];  Service: ENT;  Laterality: N/A;   Social History   Socioeconomic History   Marital status: Married    Spouse name: Not on file   Number of children: Not on file   Years of education: Not on file   Highest education level: Not on file  Occupational History   Not on file  Tobacco Use   Smoking status: Never   Smokeless tobacco: Never  Vaping Use   Vaping Use: Never used  Substance and Sexual Activity   Alcohol use: No   Drug use: No   Sexual activity: Not on file  Other Topics Concern   Not on file  Social History Narrative   Not on file   Social Determinants of Health   Financial Resource Strain: Not on file  Food Insecurity: Not on file  Transportation Needs: Not on file  Physical Activity: Not on file  Stress: Not on file  Social Connections: Not on file   Outpatient Encounter Medications as of 09/29/2021  Medication Sig   acetaminophen (TYLENOL) 500 MG tablet Take 1,000 mg by mouth every 8 (eight) hours  as needed for moderate pain.   calcium carbonate (OS-CAL) 1250 (500 Ca) MG chewable tablet Chew 1 tablet (1,250 mg total) by mouth daily with breakfast.   cetirizine (ZYRTEC) 10 MG tablet Take 10 mg by mouth daily.   Cholecalciferol (VITAMIN D3) 125 MCG (5000 UT) CAPS Take 1 capsule (5,000 Units total) by mouth daily.   hydrochlorothiazide (HYDRODIURIL) 25 MG tablet Take 1 tablet (25 mg total) by mouth daily.   levothyroxine (EUTHYROX) 175 MCG tablet Take 1 tablet (175 mcg total) by mouth daily before breakfast.   losartan (COZAAR) 50 MG tablet daily.   No facility-administered encounter medications on file as of 09/29/2021.   ALLERGIES: No Known Allergies  VACCINATION STATUS:  There is no immunization history on file for this patient.  HPI Carlos Payne is 61 y.o. male who presents today with a medical history as above. he is being seen in  follow-up for thyroid cancer status post near total thyroidectomy and I-131 thyroid remnant ablation.   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  Completion surgery on September 04, 2017 with right lobe thyroidectomy showed benign thyroid parenchyma, benign parathyroid gland, with no evidence of malignancy.  -After his last visit with [place] here, he was scheduled to receive Thyrogen stimulated thyroid remnant ablation with I-131 which was delivered on October 22, 2017 followed by whole body scan on November 01, 2017.  His whole body scan was negative for distant metastasis.  Subsequent thyroid/neck ultrasound has been negative for any evidence of residual or recurrent disease.   -Prior to his last visit, the second stimulated whole-body scan was negative for actually evidence of relapse or recurrence.  -Before his last visit  thyroid/neck ultrasound was also negative for residual disease. -He remains on low-dose calcium supplement, and  levothyroxine 175 mcg p.o. daily before breakfast.     He reports compliance and consistency in taking his medications.  He has no new complaints today.     His previsit labs also show vitamin D deficiency.   Previously, his labs showed low PTH of 12.  -He denies exposure to neck radiation.  He has steady weight.  His previsit labs show hyperlipidemia. -He denies new complaints.  -He denies dysphagia, odynophagia, change in his voice.  Review of Systems  Limited as above.  Objective:    BP (!) 148/100    Pulse 84    Ht 5\' 8"  (1.727 m)    Wt 285 lb 6.4 oz (129.5 kg)    BMI 43.39 kg/m   Wt Readings from Last 3 Encounters:  09/29/21 285 lb 6.4 oz (129.5 kg)  03/26/21 281 lb 9.6 oz (127.7 kg)  09/16/20 287 lb 9.6 oz (130.5 kg)    Physical Exam   CMP     Component Value Date/Time   NA 143 09/13/2021 0000   K 4.8 09/13/2021 0000   CL 101 09/13/2021 0000   CO2 25 (A) 09/13/2021 0000   GLUCOSE 116 (H) 08/22/2018 1552   BUN 24 (A) 09/13/2021 0000   CREATININE 1.4 (A) 09/13/2021 0000   CREATININE 1.03 08/22/2018 1552   CALCIUM 9.5 09/13/2021 0000   CALCIUM 9.2 05/25/2018 1630   PROT 7.5 08/22/2018 1552   ALBUMIN 4.6 09/13/2021 0000   AST 33 09/13/2021 0000   ALT 47 (A) 09/13/2021 0000   ALKPHOS 67 09/13/2021 0000   BILITOT 0.8 08/22/2018 1552   GFRNONAA 66 08/30/2020 0000   GFRAA 76 08/30/2020 0000    Lab Results  Component Value Date   TSH 0.98 09/13/2021   TSH 2.49 03/22/2021   TSH 1.82 08/30/2020   TSH 1.41 02/24/2020   TSH 0.204 (L) 08/22/2018   TSH 2.010 05/25/2018   TSH 1.309 11/01/2017   TSH 7.37 (A) 10/08/2017   FREET4 1.27 (H) 08/18/2019   FREET4 1.00 08/22/2018   FREET4 0.99 05/25/2018   FREET4 1.08 11/01/2017       October 08, 2017 lab work from outside facility showed calcium 9.3, TSH elevated at 7.3 7, free T4 low normal at 0.93     Review of his surgical pathology from August 24, 2017 showed 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  Tumor capsule was focally  invaded by tumor, no extrathyroidal extension, margin free of tumor, lymphatic/vascular invasion present, no lymph nodes were examined.  TNM code p T3a, pNX .  -Thyrogen stimulated remnant ablation  was administered on October 22, 2017 followed by a negative whole-body scan on November 01, 2017.  Ultrasound of neck on August 19, 2018 is negative for tumor recurrence.   Thyrogen stimulated whole-body scan on August 21, 2019  FINDINGS: Physiologic activity within the nasopharynx, liver and bladder. There is no abnormal activity within the neck or elsewhere to suggest local recurrence or metastatic disease.   IMPRESSION: Normal examination post thyroidectomy and thyroid ablation. No evidence of local recurrence or metastatic disease.   Thyroid/neck ultrasound on August 28, 2020 No regional cervical lymphadenopathy.  IMPRESSION: Post total thyroidectomy without evidence of residual or locally recurrent disease.  Assessment & Plan:   1. Thyroid cancer (HCC)-Hurthle cell carcinoma 2. Postsurgical hypothyroidism 3.  Postsurgical hypocalcemia  -He has had a stage 3 Hurthle cell carcinoma status post total thyroidectomy on 2 stages.  Left hemithyroidectomy on August 24, 2017 and completion right hemithyroidectomy on September 04, 2017 with no evidence of malignancy on the right side. 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 -Status post Thyrogen stimulated thyroid remnant ablation with negative whole-body scan.  -His thyroglobulin level is 0.3 associated with undetectable antithyroglobulin antibodies. -This completes his initial treatment for thyroid cancer.  -Risk of tumor recurrence in the next 5 - 10 years, and the need for consistent follow-up with periodic imaging studies is  emphasized to the patient and his wife in exam room.    -His thyroid/neck ultrasound on August 19, 2018 is negative.  -His previsit Thyrogen stimulated whole-body scan -normal exam post thyroidectomy and thyroid ablation.  No evidence of local recurrence or metastatic disease.   -His last surveillance thyroid/neck ultrasound is negative for residual or recurrent disease.   -He will be considered for thyroid and stimulated whole-body scan before his next visit in 6 months.     -For his postsurgical hypothyroidism: -His previsit thyroid function tests are consistent with appropriate replacement, advised to continue levothyroxine 175 mcg p.o. daily before breakfast.    . Target TSH for him would be between 0.1-0.5 for up to 5 years.   - We discussed about the correct intake of his thyroid hormone, on empty stomach at fasting, with water, separated by at least 30 minutes from breakfast and other medications,  and separated by more than 4 hours from calcium, iron, multivitamins, acid reflux medications (PPIs). -Patient is made aware of the fact that thyroid hormone replacement is needed for life, dose to be adjusted by periodic monitoring of thyroid function tests.    -His recent CMP showed calcium 9.1 while he was taking calcium supplements.  He is advised to continue calcium carbonate 1250 mg - 500 mg elemental calcium p.o. daily at breakfast.    -Regarding his vitamin D deficiency: I discussed initiated vitamin D3 5000 units daily. Regarding his uncontrolled hypertension, he is advised to increase his hydrochlorothiazide to 25 mg p.o. daily with breakfast, along with his Cozaar 50 mg p.o. daily. He has hyperlipidemia with LDL at 131.  This patient will benefit from lifestyle medicine. - he acknowledges that there is a room for improvement in his food and drink choices. - Suggestion is made for him to avoid simple carbohydrates  from his diet including Cakes, Sweet Desserts, Ice  Cream, Soda  (diet and regular), Sweet Tea, Candies, Chips, Cookies, Store Bought Juices, Alcohol , Artificial Sweeteners,  Coffee Creamer, and "Sugar-free" Products, Lemonade.    The following Lifestyle Medicine recommendations according to [place]  [facility]) were discussed and and offered to patient and he  agrees to start the journey:  A. Whole Foods, Plant-Based Nutrition comprising of fruits and vegetables, plant-based proteins, whole-grain carbohydrates was discussed in detail with the patient.   A list for source of those nutrients were also provided to the patient.  Patient will use only water or unsweetened tea for hydration. B.  The need to stay away from risky substances including alcohol, smoking; obtaining 7 to 9 hours of restorative sleep, at least 150 minutes of moderate intensity exercise weekly, the importance of healthy social connections,  and stress management techniques were discussed.   - I advised patient to maintain close follow up with Burdine, Virgina Evener, MD for primary care needs.   I spent 35 minutes in the care of the patient today including review of labs from Thyroid Function, CMP, and other relevant labs ; imaging/biopsy records (current and previous including abstractions from other facilities); face-to-face time discussing  his lab results and symptoms, medications doses, his options of short and long term treatment based on the latest standards of care / guidelines;   and documenting the encounter.  Anise Salvo  participated in the discussions, expressed understanding, and voiced agreement with the above plans.  All questions were answered to his satisfaction. he is encouraged to contact clinic should he have any questions or concerns prior to his return visit.  Follow up plan: Return in about 6 months (around 03/29/2022) for F/U with Pre-visit Labs, F/U with Whole Body Scan w/Thyrogen.   Glade Lloyd, MD [facility] [address] [place], [place] [address] Phone: [phone]  Fax: [phone]     09/29/2021, 6:03 PM  This note was partially dictated with voice recognition software. Similar sounding words can be transcribed inadequately or may not  be corrected upon review.

## 2022-02-20 ENCOUNTER — Other Ambulatory Visit: Payer: Self-pay | Admitting: "Endocrinology

## 2022-03-11 NOTE — Written Directive (Addendum)
MOLECULAR IMAGING AND THERAPEUTICS WRITTEN DIRECTIVE   PATIENT NAME: Carlos Payne  PT DOB:   Feb 27, 1961                                              MRN: [id number]  ---------------------------------------------------------------------------------------------------------------------  I-131 WHOLE BODY SCAN    RADIOPHARMACEUTICAL: Iodine-131 Capsule for Diagnostic Imaging   PRESCRIBED DOSE FOR ADMINISTRATION: 4 mCi   (four milliCuries)   ROUTE OFADMINISTRATION: PO   DIAGNOSIS: Thyroid Cancer    REFERRING PHYSICIAN: Dr. Donia Ast STIMULATION OR HORMONE WITHDRAW: Thyrogen   DATE OF THYROIDECTOMY: 09-04-2017   SURGEON: Dr. Benjamine Mola   TSH:   Lab Results  Component Value Date   TSH 0.98 09/13/2021   TSH 2.49 03/22/2021   TSH 1.82 08/30/2020     PRIOR I-131 THERAPY (Date and Dose): 10-22-2017  153 mCi    ADDITIONAL PHYSICIAN COMMENTS/NOTES   AUTHORIZED USER SIGNATURE & TIME STAMP:

## 2022-03-18 ENCOUNTER — Encounter (HOSPITAL_COMMUNITY): Payer: Self-pay

## 2022-03-18 ENCOUNTER — Encounter (HOSPITAL_COMMUNITY)
Admission: RE | Admit: 2022-03-18 | Discharge: 2022-03-18 | Disposition: A | Discharge status: Home / Self Care | Payer: BC Managed Care – PPO | Source: Ambulatory Visit | Attending: "Endocrinology | Admitting: "Endocrinology

## 2022-03-18 DIAGNOSIS — Z85850 Personal history of malignant neoplasm of thyroid: Secondary | ICD-10-CM | POA: Diagnosis not present

## 2022-03-18 DIAGNOSIS — E890 Postprocedural hypothyroidism: Secondary | ICD-10-CM | POA: Insufficient documentation

## 2022-03-18 DIAGNOSIS — C73 Malignant neoplasm of thyroid gland: Secondary | ICD-10-CM | POA: Diagnosis present

## 2022-03-18 MED ORDER — THYROTROPIN ALFA 0.9 MG IM SOLR: 0.9000 mg | INTRAMUSCULAR | Status: AC

## 2022-03-18 MED ORDER — STERILE WATER FOR INJECTION IJ SOLN
INTRAMUSCULAR | Status: AC
  Filled 2022-03-18: qty 10

## 2022-03-18 MED ORDER — THYROTROPIN ALFA 0.9 MG IM SOLR
INTRAMUSCULAR | Status: AC
  Administered 2022-03-18: 0.9 mg via INTRAMUSCULAR
  Filled 2022-03-18: qty 0.9

## 2022-03-19 ENCOUNTER — Encounter (HOSPITAL_COMMUNITY)
Admission: RE | Admit: 2022-03-19 | Discharge: 2022-03-19 | Disposition: A | Discharge status: Home / Self Care | Payer: BC Managed Care – PPO | Source: Ambulatory Visit | Attending: "Endocrinology | Admitting: "Endocrinology

## 2022-03-19 DIAGNOSIS — Z85850 Personal history of malignant neoplasm of thyroid: Secondary | ICD-10-CM | POA: Diagnosis not present

## 2022-03-19 MED ORDER — THYROTROPIN ALFA 0.9 MG IM SOLR
INTRAMUSCULAR | Status: DC
Start: 2022-03-19 — End: 2022-03-19
  Filled 2022-03-19: qty 0.9

## 2022-03-19 MED ORDER — STERILE WATER FOR INJECTION IJ SOLN
INTRAMUSCULAR | Status: AC
  Filled 2022-03-19: qty 10

## 2022-03-19 MED ORDER — THYROTROPIN ALFA 0.9 MG IM SOLR
0.9000 mg | INTRAMUSCULAR | Status: AC
  Administered 2022-03-19: 0.9 mg via INTRAMUSCULAR

## 2022-03-20 ENCOUNTER — Encounter (HOSPITAL_COMMUNITY)
Admission: RE | Admit: 2022-03-20 | Discharge: 2022-03-20 | Disposition: A | Discharge status: Home / Self Care | Payer: BC Managed Care – PPO | Source: Ambulatory Visit | Attending: "Endocrinology | Admitting: "Endocrinology

## 2022-03-20 ENCOUNTER — Other Ambulatory Visit (HOSPITAL_COMMUNITY)
Admission: RE | Admit: 2022-03-20 | Discharge: 2022-03-20 | Disposition: A | Discharge status: Home / Self Care | Payer: BC Managed Care – PPO | Source: Ambulatory Visit | Attending: "Endocrinology | Admitting: "Endocrinology

## 2022-03-20 DIAGNOSIS — E890 Postprocedural hypothyroidism: Secondary | ICD-10-CM | POA: Diagnosis present

## 2022-03-20 LAB — LIPID PANEL
Cholesterol: 179 mg/dL (ref 0–200)
HDL: 42 mg/dL (ref >40)
LDL Cholesterol: 110 mg/dL — ABNORMAL HIGH (ref 0–99)
Total CHOL/HDL Ratio: 4.3 RATIO
Triglycerides: 136 mg/dL (ref <150)
VLDL: 27 mg/dL (ref 0–40)

## 2022-03-20 LAB — TSH: TSH: 84.68 u[IU]/mL — ABNORMAL HIGH (ref 0.350–4.500)

## 2022-03-20 LAB — T4, FREE: Free T4: 1.04 ng/dL (ref 0.61–1.12)

## 2022-03-20 MED ORDER — SODIUM IODIDE I 131 CAPSULE
4.1000 | Freq: Once | INTRAVENOUS | Status: AC | PRN
  Administered 2022-03-20: 4.1 via ORAL

## 2022-03-23 ENCOUNTER — Encounter (HOSPITAL_COMMUNITY)
Admission: RE | Admit: 2022-03-23 | Discharge: 2022-03-23 | Disposition: A | Discharge status: Home / Self Care | Payer: BC Managed Care – PPO | Source: Ambulatory Visit | Attending: "Endocrinology | Admitting: "Endocrinology

## 2022-03-25 LAB — COMPREHENSIVE THYROGLOBULIN
Anti-Thyroglobulin Antibodies: <1 IU/mL
Thyroglobulin (ICMA): <0.1 ng/mL

## 2022-03-30 ENCOUNTER — Encounter: Payer: Self-pay | Admitting: "Endocrinology

## 2022-03-30 ENCOUNTER — Ambulatory Visit (INDEPENDENT_AMBULATORY_CARE_PROVIDER_SITE_OTHER): Payer: BC Managed Care – PPO | Admitting: "Endocrinology

## 2022-03-30 VITALS — BP 118/88 | HR 68 | Ht 68.0 in | Wt 282.0 lb

## 2022-03-30 DIAGNOSIS — Z85850 Personal history of malignant neoplasm of thyroid: Secondary | ICD-10-CM | POA: Diagnosis not present

## 2022-03-30 DIAGNOSIS — E890 Postprocedural hypothyroidism: Secondary | ICD-10-CM | POA: Diagnosis not present

## 2022-03-30 DIAGNOSIS — I10 Essential (primary) hypertension: Secondary | ICD-10-CM

## 2022-03-30 DIAGNOSIS — E782 Mixed hyperlipidemia: Secondary | ICD-10-CM | POA: Diagnosis not present

## 2022-03-30 NOTE — Progress Notes (Signed)
03/30/2022, 8:27 PM              Endocrinology follow-up note   Subjective:    Patient ID: Carlos Payne, male    DOB: 25-May-1961, PCP Burdine, Virgina Evener, MD   Past Medical History:  Diagnosis Date   Cancer [facility])    thyroid cancer   Dyspnea    GERD (gastroesophageal reflux disease)    Hypertension    Needle phobia    severe, needs to lie down for sticks   Seizures ([place])    as a child   Thyroid mass    left   Wears hearing aid in both ears    Past Surgical History:  Procedure Laterality Date   EYE SURGERY     foreign object removed   FINGER SURGERY Right    index   HERNIA REPAIR     age 72   MULTIPLE TOOTH EXTRACTIONS     THYROIDECTOMY Left 08/24/2017   Procedure: LEFT HEMI THYROIDECTOMY;  Surgeon: Leta Baptist, MD;  Location: [place];  Service: ENT;  Laterality: Left;   THYROIDECTOMY N/A 09/04/2017   Procedure: COMPLETION THYROIDECTOMY;  Surgeon: Leta Baptist, MD;  Location: [facility] OR;  Service: ENT;  Laterality: N/A;   Social History   Socioeconomic History   Marital status: Married    Spouse name: Not on file   Number of children: Not on file   Years of education: Not on file   Highest education level: Not on file  Occupational History   Not on file  Tobacco Use   Smoking status: Never   Smokeless tobacco: Never  Vaping Use   Vaping Use: Never used  Substance and Sexual Activity   Alcohol use: No   Drug use: No   Sexual activity: Not on file  Other Topics Concern   Not on file  Social History Narrative   Not on file   Social Determinants of Health   Financial Resource Strain: Not on file  Food Insecurity: Not on file  Transportation Needs: Not on file  Physical Activity: Not on file  Stress: Not on file  Social Connections: Not on file   Outpatient Encounter Medications as of 03/30/2022  Medication Sig   acetaminophen (TYLENOL) 500 MG tablet Take 1,000 mg by mouth every 8 (eight) hours  as needed for moderate pain.   calcium carbonate (OS-CAL) 1250 (500 Ca) MG chewable tablet Chew 1 tablet (1,250 mg total) by mouth daily with breakfast.   cetirizine (ZYRTEC) 10 MG tablet Take 10 mg by mouth daily.   Cholecalciferol (VITAMIN D3) 125 MCG (5000 UT) CAPS Take 1 capsule (5,000 Units total) by mouth daily.   hydrochlorothiazide (HYDRODIURIL) 25 MG tablet Take 1 tablet (25 mg total) by mouth daily.   levothyroxine (SYNTHROID) 175 MCG tablet TAKE 1 TABLET BY MOUTH ONCE DAILY BEFORE  BREAKFAST   losartan (COZAAR) 50 MG tablet daily.   No facility-administered encounter medications on file as of 03/30/2022.   ALLERGIES: No Known Allergies  VACCINATION STATUS:  There is no immunization history on file for this patient.  HPI Carlos Payne is 61 y.o. male who presents today with a medical history as above. he is being seen in follow-up  for thyroid cancer status post near total thyroidectomy and I-131 thyroid remnant ablation.   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  Completion surgery on September 04, 2017 with right lobe thyroidectomy showed benign thyroid parenchyma, benign parathyroid gland, with no evidence of malignancy.  -After his last visit with [place] here, he was scheduled to receive Thyrogen stimulated thyroid remnant ablation with I-131 which was delivered on October 22, 2017 followed by whole body scan on November 01, 2017.  His whole body scan was negative for distant metastasis.  Subsequent thyroid/neck ultrasound has been negative for any evidence of residual or recurrent disease.   -Prior to his last visit, the second stimulated whole-body scan was negative for evidence of relapse or recurrence.  -Before his last visit  thyroid/neck ultrasound was also negative for residual disease. -He remains on low-dose calcium supplement, and levothyroxine 175  mcg p.o. daily before breakfast.     He reports compliance and consistency in taking his medications.  He has no new complaints today.     His previsit labs also show  significant hyperlipidemia.  Previously, his labs showed low PTH of 12.  -He denies exposure to neck radiation.  He has steady weight.  His previsit labs show hyperlipidemia. -He denies new complaints.  -He denies dysphagia, odynophagia, change in his voice.  Review of Systems  Limited as above.  Objective:    BP 118/88   Pulse 68   Ht '5\' 8"'$  (1.727 m)   Wt 282 lb (127.9 kg)   BMI 42.88 kg/m   Wt Readings from Last 3 Encounters:  03/30/22 282 lb (127.9 kg)  09/29/21 285 lb 6.4 oz (129.5 kg)  03/26/21 281 lb 9.6 oz (127.7 kg)    Physical Exam   CMP     Component Value Date/Time   NA 143 09/13/2021 0000   K 4.8 09/13/2021 0000   CL 101 09/13/2021 0000   CO2 25 (A) 09/13/2021 0000   GLUCOSE 116 (H) 08/22/2018 1552   BUN 24 (A) 09/13/2021 0000   CREATININE 1.4 (A) 09/13/2021 0000   CREATININE 1.03 08/22/2018 1552   CALCIUM 9.5 09/13/2021 0000   CALCIUM 9.2 05/25/2018 1630   PROT 7.5 08/22/2018 1552   ALBUMIN 4.6 09/13/2021 0000   AST 33 09/13/2021 0000   ALT 47 (A) 09/13/2021 0000   ALKPHOS 67 09/13/2021 0000   BILITOT 0.8 08/22/2018 1552   GFRNONAA 66 08/30/2020 0000   GFRAA 76 08/30/2020 0000    Lab Results  Component Value Date   TSH 84.680 (H) 03/20/2022   TSH 0.98 09/13/2021   TSH 2.49 03/22/2021   TSH 1.82 08/30/2020   TSH 1.41 02/24/2020   TSH 0.204 (L) 08/22/2018   TSH 2.010 05/25/2018   TSH 1.309 11/01/2017   TSH 7.37 (A) 10/08/2017   FREET4 1.04 03/20/2022   FREET4 1.27 (H) 08/18/2019   FREET4 1.00 08/22/2018   FREET4 0.99 05/25/2018   FREET4 1.08 11/01/2017    Lipid Panel     Component Value Date/Time   CHOL 179 03/20/2022 0831   TRIG 136 03/20/2022 0831   HDL 42 03/20/2022 0831   CHOLHDL 4.3 03/20/2022 0831   VLDL 27 03/20/2022 0831   LDLCALC 110 (H) 03/20/2022 0831       October 08, 2017 lab work from outside facility showed calcium 9.3, TSH elevated at 7.3 7, free  T4 low normal at 0.93     Review of his surgical pathology from August 24, 2017 showed 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  Tumor capsule was focally invaded by tumor, no extrathyroidal extension, margin free of tumor, lymphatic/vascular invasion present, no lymph nodes were examined.  TNM code p T3a, pNX .  -Thyrogen stimulated remnant ablation  was administered on October 22, 2017 followed by a negative whole-body scan on November 01, 2017.  Ultrasound of neck on August 19, 2018 is negative for tumor recurrence.   Thyrogen stimulated whole-body scan on August 21, 2019  FINDINGS: Physiologic activity within the nasopharynx, liver and bladder. There is no abnormal activity within the neck or elsewhere to suggest local recurrence or metastatic disease.   IMPRESSION: Normal examination post thyroidectomy and thyroid ablation. No evidence of local recurrence or metastatic disease.   Thyroid/neck ultrasound on August 28, 2020 No regional cervical lymphadenopathy.  IMPRESSION: Post total thyroidectomy without evidence of residual or locally recurrent disease.  Assessment & Plan:   1. Thyroid cancer (HCC)-Hurthle cell carcinoma 2. Postsurgical hypothyroidism 3.  Postsurgical hypocalcemia 4. hyperlipidemia  -He has had a stage 3 Hurthle cell carcinoma status post total thyroidectomy on 2 stages.  Left hemithyroidectomy on August 24, 2017 and completion right hemithyroidectomy on September 04, 2017 with no evidence of malignancy on the right side. 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 -Status post Thyrogen stimulated thyroid remnant ablation with negative whole-body scan.  -His thyroglobulin  level is 0.3 associated with undetectable antithyroglobulin antibodies. -This completes his initial treatment for thyroid cancer.  -Risk of tumor recurrence in the next 5 - 10 years, and the need for consistent follow-up with periodic imaging studies is emphasized to the patient and his wife in exam room.    -His thyroid/neck ultrasound on August 19, 2018 is negative.  -His previsit Thyrogen stimulated whole-body scan -normal exam post thyroidectomy and thyroid ablation.  No evidence of local recurrence or metastatic disease.   -His last surveillance thyroid/neck ultrasound is negative for residual or recurrent disease.   -His previsit thyrogen  stimulated whole-body scan is negative for tumor recurrence.    -For his postsurgical hypothyroidism: -His previsit thyroid function tests are consistent with appropriate replacement, advised to continue levothyroxine 175 mcg p.o. daily before breakfast.    . Target TSH for him would be between 0.1-0.5 for up to 5 years.   - We discussed about the correct intake of his thyroid hormone, on empty stomach at fasting, with water, separated by at least 30 minutes from breakfast and other medications,  and separated by more than 4 hours from calcium, iron, multivitamins, acid reflux medications (PPIs). -Patient is made aware of the fact that thyroid hormone replacement is needed for life, dose to be adjusted by periodic monitoring of thyroid function tests.     -His recent CMP showed calcium 9.1 while he was taking calcium supplements.  He is advised to continue calcium carbonate 1250 mg - 500 mg elemental calcium p.o. daily at breakfast.    -Regarding his vitamin D deficiency: I discussed initiated vitamin D3 5000 units daily. His BP is controlled . He is advised to continue  hydrochlorothiazide  25 mg p.o. daily with breakfast, along with his Cozaar 50 mg p.o. daily. He has hyperlipidemia with LDL  at  110 only slightly improving from 131.   - he  acknowledges that there is a room for improvement in his food and drink choices. - Suggestion is made for him to avoid simple carbohydrates  from his diet including Cakes, Sweet Desserts, Ice Cream, Soda (diet and regular), Sweet Tea, Candies, Chips, Cookies, Store Bought Juices, Alcohol , Artificial Sweeteners,  Coffee Creamer, and "Sugar-free" Products, Lemonade. This will help patient to have more stable blood glucose profile and potentially avoid unintended weight gain.  The following Lifestyle Medicine recommendations according to [facility]) were discussed and and offered to patient and he  agrees to start the journey:  A. Whole Foods, Plant-Based Nutrition comprising of fruits and vegetables, plant-based proteins, whole-grain carbohydrates was discussed in detail with the patient.   A list for source of those nutrients were also provided to the patient.  Patient will use only water or unsweetened tea for hydration. B.  The need to stay away from risky substances including alcohol, smoking; obtaining 7 to 9 hours of restorative sleep, at least 150 minutes of moderate intensity exercise weekly, the importance of healthy social connections,  and stress management techniques were discussed. C.  A full color page of  Calorie density of various food groups per pound showing examples of each food groups was provided to the patient.    - I advised patient to maintain close follow up with Burdine, Virgina Evener, MD for primary care needs.    I spent 33 minutes in the care of the patient today including review of labs from Thyroid Function, CMP, and other relevant labs ; imaging/biopsy records (current and previous including abstractions from other facilities); face-to-face time discussing  his lab results and symptoms, medications doses, his options of short and long term treatment based on the latest standards of care / guidelines;   and documenting the encounter.  Anise Salvo  participated in the discussions, expressed understanding, and voiced agreement with the above plans.  All questions were answered to his satisfaction. he is encouraged to contact clinic should he have any questions or concerns prior to his return visit.   Follow up plan: Return in about 6 months (around 09/30/2022) for Fasting Labs  in AM B4 8, A1c -NV.   Glade Lloyd, MD [facility] [address] [place], [place] [address] Phone: [phone]  Fax: [phone]     03/30/2022, 8:27 PM  This note was partially dictated with voice recognition software. Similar sounding words can be transcribed inadequately or may not  be corrected upon review.

## 2022-03-30 NOTE — Patient Instructions (Signed)
                                     Advice for Weight Management  -For most of us the best way to lose weight is by diet management. Generally speaking, diet management means consuming less calories intentionally which over time brings about progressive weight loss.  This can be achieved more effectively by avoiding ultra processed carbohydrates, processed meats, unhealthy fats.    It is critically important to know your numbers: how much calorie you are consuming and how much calorie you need. More importantly, our carbohydrates sources should be unprocessed naturally occurring  complex starch food items.  It is always important to balance nutrition also by  appropriate intake of proteins (mainly plant-based), healthy fats/oils, plenty of fruits and vegetables.   -The American College of Lifestyle Medicine (ACL M) recommends nutrition derived mostly from Whole Food, Plant Predominant Sources example an apple instead of applesauce or apple pie. Eat Plenty of vegetables, Mushrooms, fruits, Legumes, Whole Grains, Nuts, seeds in lieu of processed meats, processed snacks/pastries red meat, poultry, eggs.  Use only water or unsweetened tea for hydration.  The College also recommends the need to stay away from risky substances including alcohol, smoking; obtaining 7-9 hours of restorative sleep, at least 150 minutes of moderate intensity exercise weekly, importance of healthy social connections, and being mindful of stress and seek help when it is overwhelming.    -Sticking to a routine mealtime to eat 3 meals a day and avoiding unnecessary snacks is shown to have a big role in weight control. Under normal circumstances, the only time we burn stored energy is when we are hungry, so allow  some hunger to take place- hunger means no food between appropriate meal times, only water.  It is not advisable to starve.   -It is better to avoid simple carbohydrates including:  Cakes, Sweet Desserts, Ice Cream, Soda (diet and regular), Sweet Tea, Candies, Chips, Cookies, Store Bought Juices, Alcohol in Excess of  1-2 drinks a day, Lemonade,  Artificial Sweeteners, Doughnuts, Coffee Creamers, "Sugar-free" Products, etc, etc.  This is not a complete list.....    -Consulting with certified diabetes educators is proven to provide you with the most accurate and current information on diet.  Also, you may be  interested in discussing diet options/exchanges , we can schedule a visit with Carlos Payne, RDN, CDE for individualized nutrition education.  -Exercise: If you are able: 30 -60 minutes a day ,4 days a week, or 150 minutes of moderate intensity exercise weekly.    The longer the better if tolerated.  Combine stretch, strength, and aerobic activities.  If you were told in the past that you have high risk for cardiovascular diseases, or if you are currently symptomatic, you may seek evaluation by your heart doctor prior to initiating moderate to intense exercise programs.                                  Additional Care Considerations for Diabetes/Prediabetes   -Diabetes  is a chronic disease.  The most important care consideration is regular follow-up with your diabetes care provider with the goal being avoiding or delaying its complications and to take advantage of advances in medications and technology.  If appropriate actions are taken early enough, type 2 diabetes can even be   reversed.  Seek information from the right source.  - Whole Food, Plant Predominant Nutrition is highly recommended: Eat Plenty of vegetables, Mushrooms, fruits, Legumes, Whole Grains, Nuts, seeds in lieu of processed meats, processed snacks/pastries red meat, poultry, eggs as recommended by American College of  Lifestyle Medicine (ACLM).  -Type 2 diabetes is known to coexist with other important comorbidities such as high blood pressure and high cholesterol.  It is critical to control not only the  diabetes but also the high blood pressure and high cholesterol to minimize and delay the risk of complications including coronary artery disease, stroke, amputations, blindness, etc.  The good news is that this diet recommendation for type 2 diabetes is also very helpful for managing high cholesterol and high blood blood pressure.  - Studies showed that people with diabetes will benefit from a class of medications known as ACE inhibitors and statins.  Unless there are specific reasons not to be on these medications, the standard of care is to consider getting one from these groups of medications at an optimal doses.  These medications are generally considered safe and proven to help protect the heart and the kidneys.    - People with diabetes are encouraged to initiate and maintain regular follow-up with eye doctors, foot doctors, dentists , and if necessary heart and kidney doctors.     - It is highly recommended that people with diabetes quit smoking or stay away from smoking, and get yearly  flu vaccine and pneumonia vaccine at least every 5 years.  See above for additional recommendations on exercise, sleep, stress management , and healthy social connections.      

## 2022-05-16 ENCOUNTER — Other Ambulatory Visit: Payer: Self-pay | Admitting: "Endocrinology

## 2022-06-17 IMAGING — US US THYROID
1 series · 14 of 19 positions shown · non-contrast
Comparison: 08/19/2018; 07/19/2017; nuclear medicine whole-body I
131 scan - 08/21/2019

CLINICAL DATA: Prior ultrasound follow-up. History of thyroid
cancer, post total thyroidectomy and post resection iodine ablation

EXAM:
THYROID ULTRASOUND
TECHNIQUE: Ultrasound examination of the thyroid gland and adjacent soft
tissues was performed.

[Series 1: us thyroid · 14 of 19 slices shown]
[im 1/19]
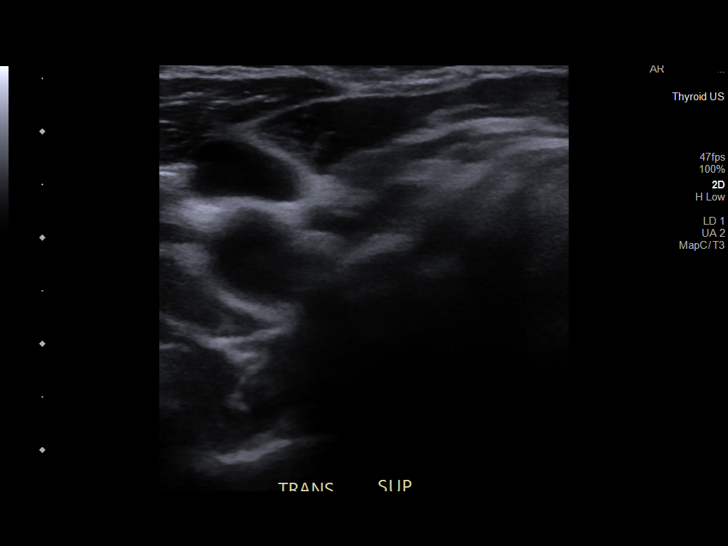
[im 3/19]
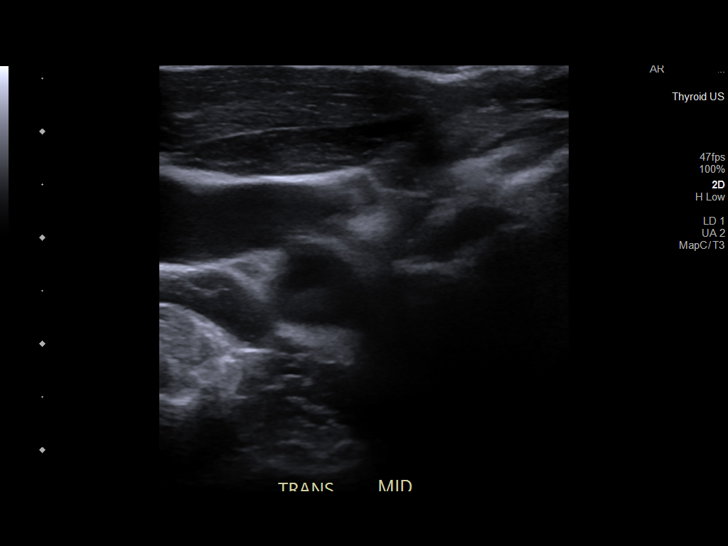
[im 4/19]
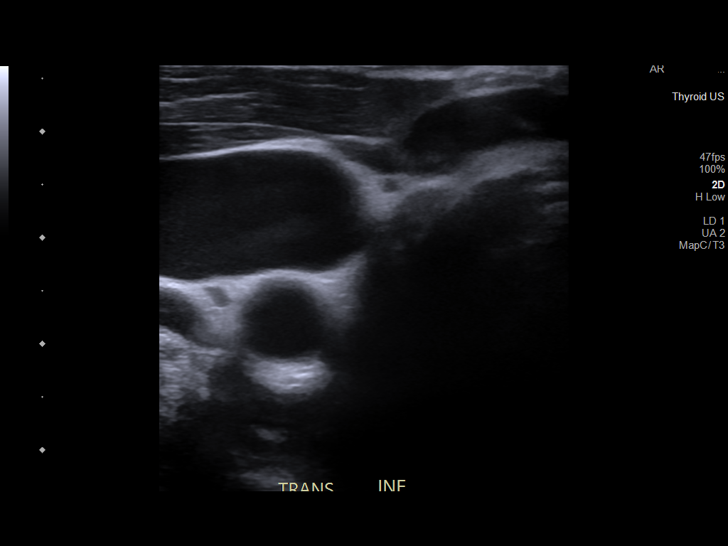
[im 5/19]
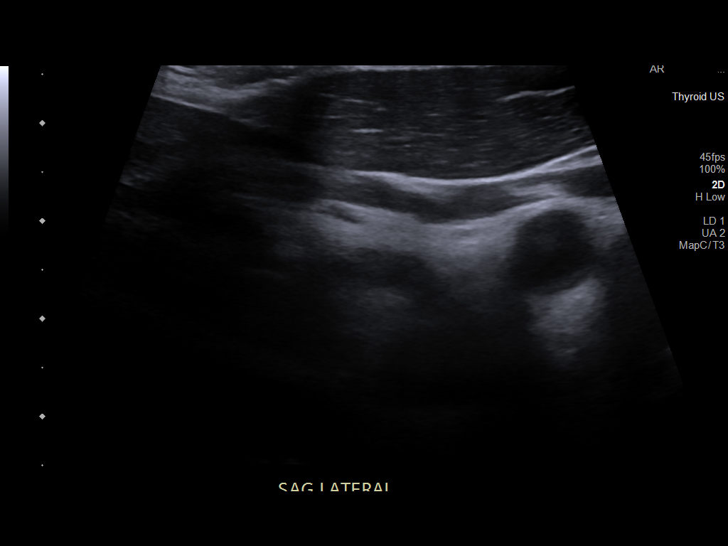
[im 7/19]
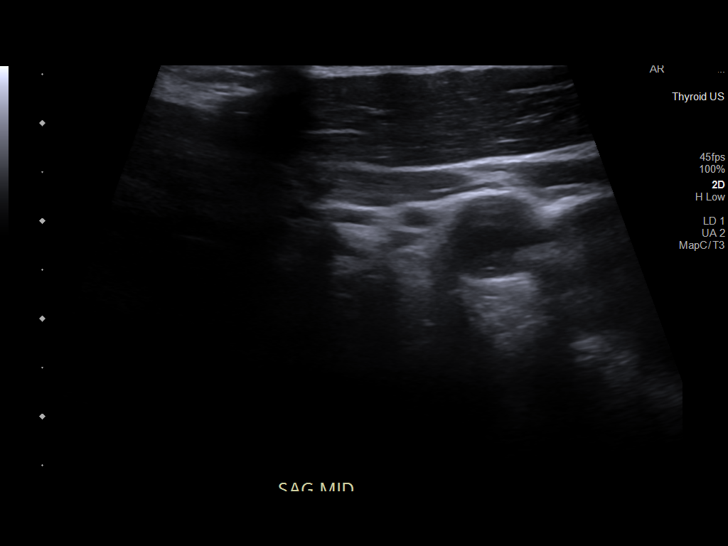
[im 8/19]
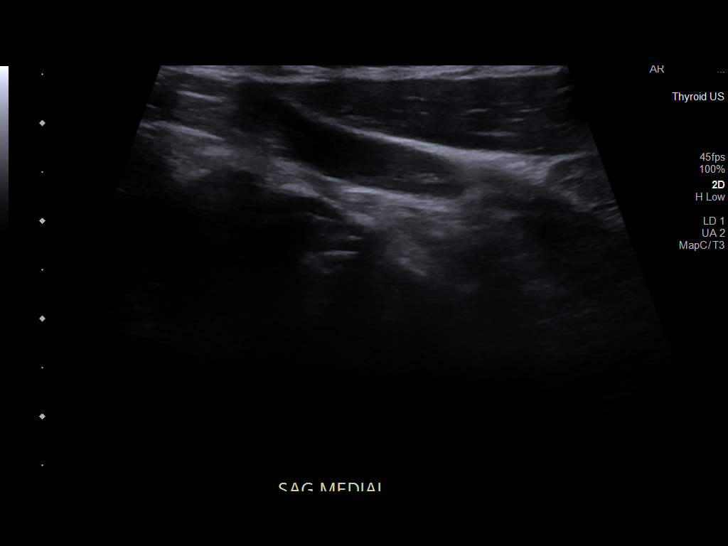
[im 9/19]
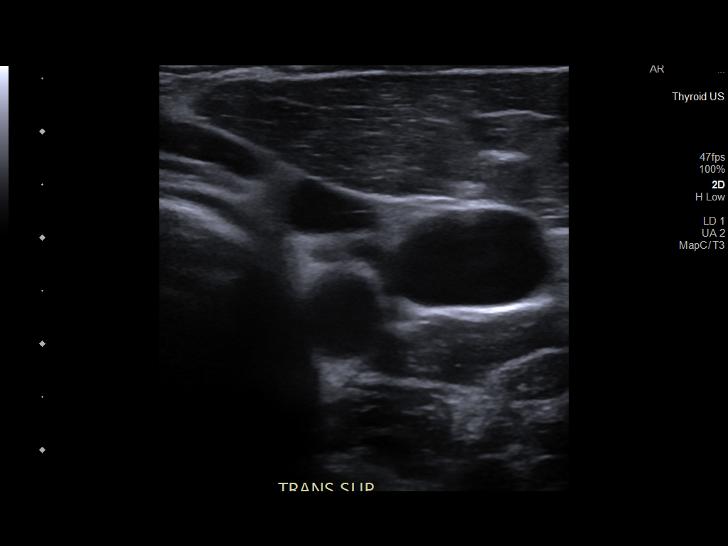
[im 11/19]
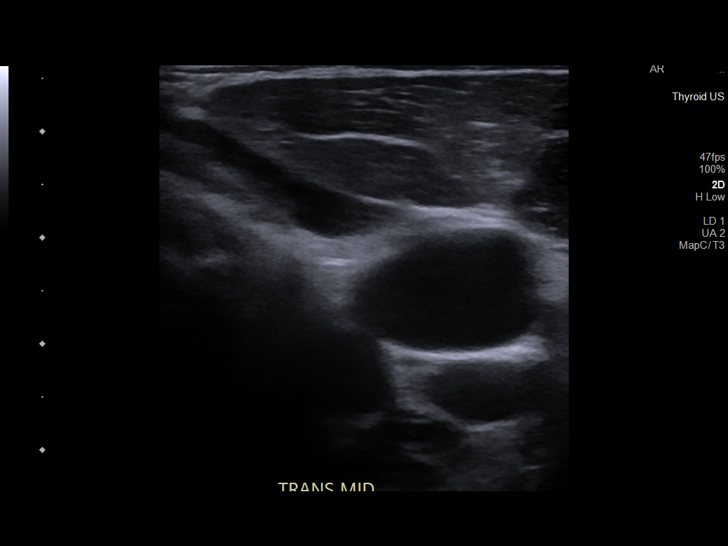
[im 12/19]
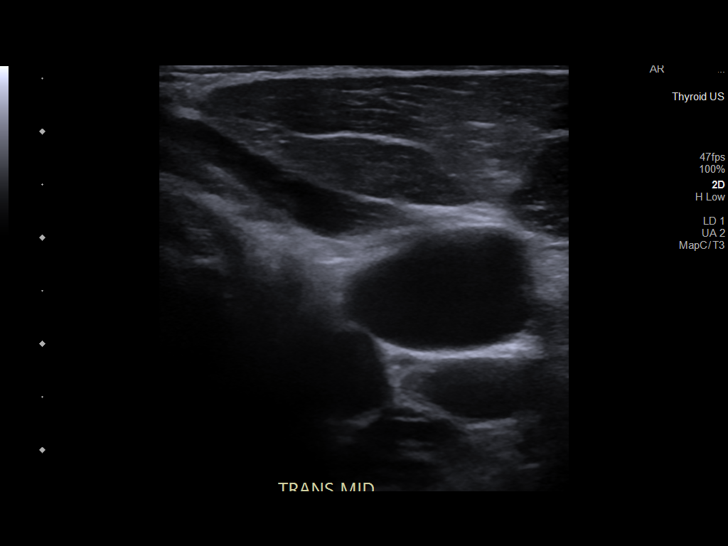
[im 13/19]
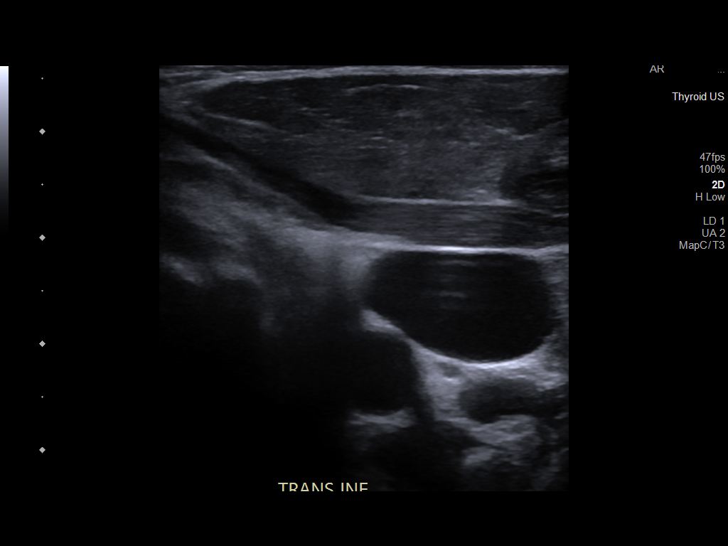
[im 15/19]
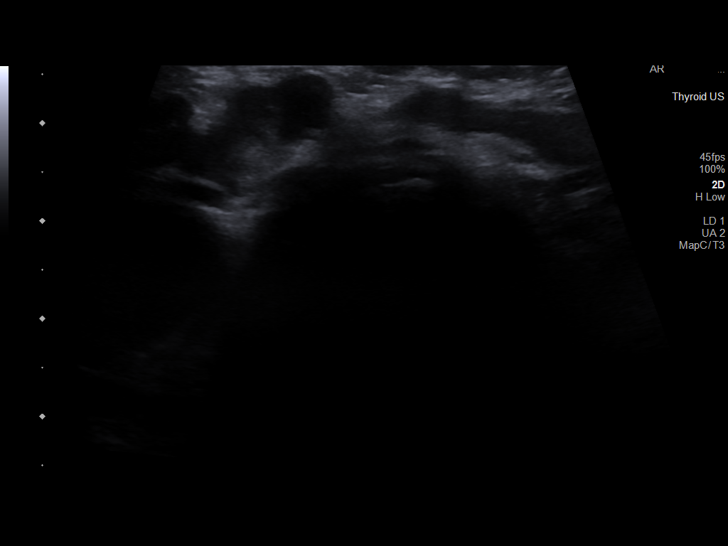
[im 16/19]
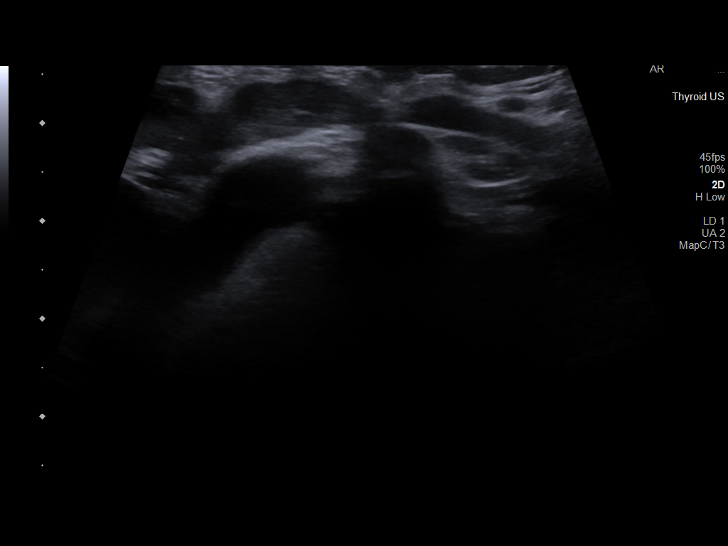
[im 17/19]
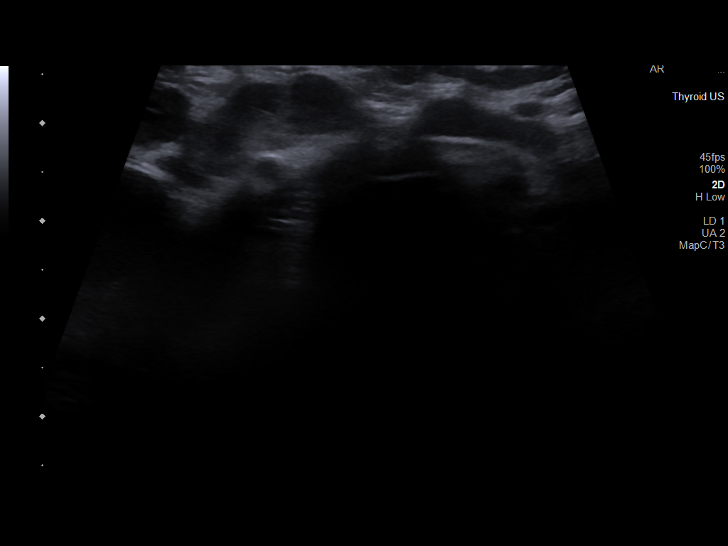
[im 19/19]
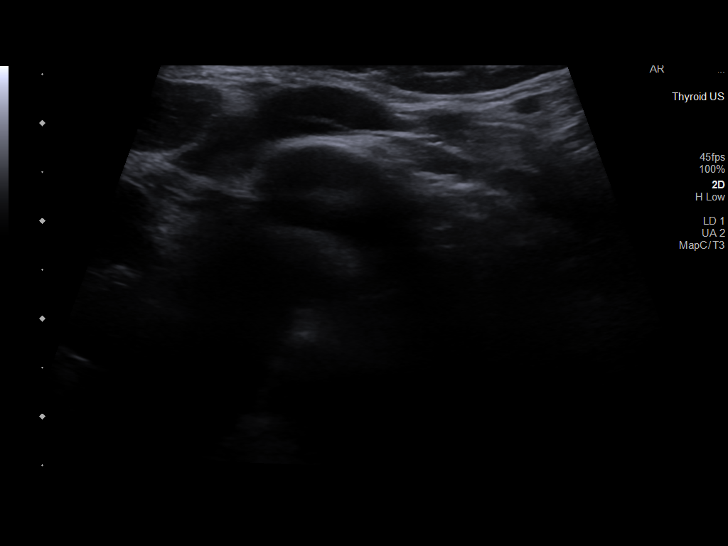

[14 of 19 positions shown; findings below may reference images not displayed]

FINDINGS: Isthmus: Surgically absent. There is no residual nodular soft tissue
within the isthmic resection bed.

Right lobe: Surgically absent. There is no residual nodular soft
tissue within the right lobectomy resection bed.

Left lobe: Surgically absent. There is no residual nodular soft
tissue within the left lobectomy resection bed.

_________________________________________________________

No regional cervical lymphadenopathy.
IMPRESSION: Post total thyroidectomy without evidence of residual or locally
recurrent disease.

## 2022-08-15 ENCOUNTER — Other Ambulatory Visit: Payer: Self-pay | Admitting: "Endocrinology

## 2022-08-18 ENCOUNTER — Other Ambulatory Visit: Payer: Self-pay

## 2022-08-18 MED ORDER — LEVOTHYROXINE SODIUM 175 MCG PO TABS: 175.0000 ug | ORAL_TABLET | Freq: Every day | ORAL | 0 refills | Status: DC

## 2022-09-21 LAB — COMPREHENSIVE METABOLIC PANEL
Albumin: 4.7 (ref 3.5–5.0)
Calcium: 9.7 (ref 8.7–10.7)
Globulin: 2.8

## 2022-09-21 LAB — LIPID PANEL
Cholesterol: 178 (ref 0–200)
HDL: 49 (ref 35–70)
LDL Cholesterol: 108
Triglycerides: 115 (ref 40–160)

## 2022-09-21 LAB — HEPATIC FUNCTION PANEL
ALT: 44 U/L — AB (ref 10–40)
AST: 43 — AB (ref 14–40)
Alkaline Phosphatase: 84 (ref 25–125)
Bilirubin, Total: 0.7

## 2022-09-21 LAB — BASIC METABOLIC PANEL
BUN: 30 — AB (ref 4–21)
CO2: 24 — AB (ref 13–22)
Chloride: 102 (ref 99–108)
Creatinine: 1.1 (ref 0.6–1.3)
Glucose: 98
Potassium: 4.8 mEq/L (ref 3.5–5.1)
Sodium: 141 (ref 137–147)

## 2022-09-21 LAB — TSH: TSH: 4.26 (ref 0.41–5.90)

## 2022-09-30 ENCOUNTER — Ambulatory Visit (INDEPENDENT_AMBULATORY_CARE_PROVIDER_SITE_OTHER): Payer: BC Managed Care – PPO | Admitting: "Endocrinology

## 2022-09-30 ENCOUNTER — Encounter: Payer: Self-pay | Admitting: "Endocrinology

## 2022-09-30 VITALS — BP 152/108 | HR 76 | Ht 68.0 in | Wt 276.6 lb

## 2022-09-30 DIAGNOSIS — E782 Mixed hyperlipidemia: Secondary | ICD-10-CM

## 2022-09-30 DIAGNOSIS — E890 Postprocedural hypothyroidism: Secondary | ICD-10-CM

## 2022-09-30 DIAGNOSIS — I10 Essential (primary) hypertension: Secondary | ICD-10-CM

## 2022-09-30 DIAGNOSIS — Z85850 Personal history of malignant neoplasm of thyroid: Secondary | ICD-10-CM | POA: Diagnosis not present

## 2022-09-30 LAB — POCT GLYCOSYLATED HEMOGLOBIN (HGB A1C): Hemoglobin A1C: 5.6 % (ref 4.0–5.6)

## 2022-09-30 MED ORDER — LEVOTHYROXINE SODIUM 175 MCG PO TABS
175.0000 ug | ORAL_TABLET | Freq: Every day | ORAL | 1 refills | Status: DC
Start: 2022-09-30 — End: 2023-08-05

## 2022-09-30 NOTE — Progress Notes (Signed)
09/30/2022, 4:27 PM              Endocrinology follow-up note   Subjective:    Patient ID: Carlos Payne, male    DOB: 1961-03-21, PCP Burdine, Virgina Evener, MD   Past Medical History:  Diagnosis Date   Cancer [facility])    thyroid cancer   Dyspnea    GERD (gastroesophageal reflux disease)    Hypertension    Needle phobia    severe, needs to lie down for sticks   Seizures ([place])    as a child   Thyroid mass    left   Wears hearing aid in both ears    Past Surgical History:  Procedure Laterality Date   EYE SURGERY     foreign object removed   FINGER SURGERY Right    index   HERNIA REPAIR     age 47   MULTIPLE TOOTH EXTRACTIONS     THYROIDECTOMY Left 08/24/2017   Procedure: LEFT HEMI THYROIDECTOMY;  Surgeon: Leta Baptist, MD;  Location: [place];  Service: ENT;  Laterality: Left;   THYROIDECTOMY N/A 09/04/2017   Procedure: COMPLETION THYROIDECTOMY;  Surgeon: Leta Baptist, MD;  Location: [facility] OR;  Service: ENT;  Laterality: N/A;   Social History   Socioeconomic History   Marital status: Married    Spouse name: Not on file   Number of children: Not on file   Years of education: Not on file   Highest education level: Not on file  Occupational History   Not on file  Tobacco Use   Smoking status: Never   Smokeless tobacco: Never  Vaping Use   Vaping Use: Never used  Substance and Sexual Activity   Alcohol use: No   Drug use: No   Sexual activity: Not on file  Other Topics Concern   Not on file  Social History Narrative   Not on file   Social Determinants of Health   Financial Resource Strain: Not on file  Food Insecurity: Not on file  Transportation Needs: Not on file  Physical Activity: Not on file  Stress: Not on file  Social Connections: Not on file   Outpatient Encounter Medications as of 09/30/2022  Medication Sig   acetaminophen (TYLENOL) 500 MG tablet Take 1,000 mg by mouth every 8 (eight) hours  as needed for moderate pain.   calcium carbonate (OS-CAL) 1250 (500 Ca) MG chewable tablet Chew 1 tablet (1,250 mg total) by mouth daily with breakfast.   cetirizine (ZYRTEC) 10 MG tablet Take 10 mg by mouth daily.   Cholecalciferol (VITAMIN D3) 125 MCG (5000 UT) CAPS Take 1 capsule (5,000 Units total) by mouth daily.   hydrochlorothiazide (HYDRODIURIL) 25 MG tablet Take 1 tablet (25 mg total) by mouth daily.   levothyroxine (SYNTHROID) 175 MCG tablet Take 1 tablet (175 mcg total) by mouth daily before breakfast.   losartan (COZAAR) 50 MG tablet daily.   [DISCONTINUED] levothyroxine (SYNTHROID) 175 MCG tablet Take 1 tablet (175 mcg total) by mouth daily before breakfast.   No facility-administered encounter medications on file as of 09/30/2022.   ALLERGIES: No Known Allergies  VACCINATION STATUS:  There is no immunization history on file for this patient.  HPI Carlos  Payne is 62 y.o. male who presents today with a medical history as above. he is being seen in follow-up for thyroid cancer status post near total thyroidectomy and I-131 thyroid remnant ablation.   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  Completion surgery on September 04, 2017 with right lobe thyroidectomy showed benign thyroid parenchyma, benign parathyroid gland, with no evidence of malignancy.  -After his last visit with [place] here, he was scheduled to receive Thyrogen stimulated thyroid remnant ablation with I-131 which was delivered on October 22, 2017 followed by whole body scan on November 01, 2017.  His whole body scan was negative for distant metastasis.  Subsequent thyroid/neck ultrasound has been negative for any evidence of residual or recurrent disease.   -Prior to his last visit, the second stimulated whole-body scan was negative for evidence of relapse or recurrence.  -Before his last visit   thyroid/neck ultrasound was also negative for residual disease. -He remains on low-dose calcium supplement, and levothyroxine 175 mcg p.o. daily before breakfast.      He reports compliance and consistency in taking his medications.  He has no new complaints today.     His previsit labs also show  significant hyperlipidemia.  Previously, his labs showed low PTH of 12.  -He denies exposure to neck radiation.  He presents pounds weight loss since last visit.  His labs show slight improvement in his dyslipidemia.  -He denies new complaints.  -He denies dysphagia, odynophagia, change in his voice.  Review of Systems  Limited as above.  Objective:    BP (!) 152/108 Comment: BP 134/92 L arm with manuel cuff  Pulse 76   Ht 5' 8"$  (1.727 m)   Wt 276 lb 9.6 oz (125.5 kg)   BMI 42.06 kg/m   Wt Readings from Last 3 Encounters:  09/30/22 276 lb 9.6 oz (125.5 kg)  03/30/22 282 lb (127.9 kg)  09/29/21 285 lb 6.4 oz (129.5 kg)    Physical Exam   CMP     Component Value Date/Time   NA 141 09/21/2022 0000   K 4.8 09/21/2022 0000   CL 102 09/21/2022 0000   CO2 24 (A) 09/21/2022 0000   GLUCOSE 116 (H) 08/22/2018 1552   BUN 30 (A) 09/21/2022 0000   CREATININE 1.1 09/21/2022 0000   CREATININE 1.03 08/22/2018 1552   CALCIUM 9.7 09/21/2022 0000   CALCIUM 9.2 05/25/2018 1630   PROT 7.5 08/22/2018 1552   ALBUMIN 4.7 09/21/2022 0000   AST 43 (A) 09/21/2022 0000   ALT 44 (A) 09/21/2022 0000   ALKPHOS 84 09/21/2022 0000   BILITOT 0.8 08/22/2018 1552   GFRNONAA 66 08/30/2020 0000   GFRAA 76 08/30/2020 0000    Lab Results  Component Value Date   TSH 4.26 09/21/2022   TSH 84.680 (H) 03/20/2022   TSH 0.98 09/13/2021   TSH 2.49 03/22/2021   TSH 1.82 08/30/2020   TSH 1.41 02/24/2020   TSH 0.204 (L) 08/22/2018   TSH 2.010 05/25/2018   TSH 1.309 11/01/2017   TSH 7.37 (A) 10/08/2017   FREET4 1.04 03/20/2022   FREET4 1.27 (H) 08/18/2019   FREET4 1.00 08/22/2018   FREET4 0.99  05/25/2018   FREET4 1.08 11/01/2017    Lipid Panel     Component Value Date/Time   CHOL 178 09/21/2022 0000   TRIG 115 09/21/2022 0000   HDL  49 09/21/2022 0000   CHOLHDL 4.3 03/20/2022 0831   VLDL 27 03/20/2022 0831   LDLCALC 108 09/21/2022 0000      October 08, 2017 lab work from outside facility showed calcium 9.3, TSH elevated at 7.3 7, free T4 low normal at 0.93     Review of his surgical pathology from August 24, 2017 showed 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  Tumor capsule was focally invaded by tumor, no extrathyroidal extension, margin free of tumor, lymphatic/vascular invasion present, no lymph nodes were examined.  TNM code p T3a, pNX .  -Thyrogen stimulated remnant ablation  was administered on October 22, 2017 followed by a negative whole-body scan on November 01, 2017.  Ultrasound of neck on August 19, 2018 is negative for tumor recurrence.   Thyrogen stimulated whole-body scan on August 21, 2019  FINDINGS: Physiologic activity within the nasopharynx, liver and bladder. There is no abnormal activity within the neck or elsewhere to suggest local recurrence or metastatic disease.   IMPRESSION: Normal examination post thyroidectomy and thyroid ablation. No evidence of local recurrence or metastatic disease.   Thyroid/neck ultrasound on August 28, 2020 No regional cervical lymphadenopathy.  IMPRESSION: Post total thyroidectomy without evidence of residual or locally recurrent disease.  Assessment & Plan:   1. Thyroid cancer (HCC)-Hurthle cell carcinoma 2. Postsurgical hypothyroidism 3.  Postsurgical hypocalcemia 4. hyperlipidemia  -He has had a stage 3 Hurthle cell carcinoma status post total thyroidectomy on 2 stages.  Left hemithyroidectomy on August 24, 2017 and completion right hemithyroidectomy on September 04, 2017 with no evidence of malignancy on the right side. -Hurthle  cell carcinoma is a variant of follicular thyroid carcinoma, known to behave in a more aggressive manner compared to papillary thyroid is normal. -Status post Thyrogen stimulated thyroid remnant ablation with negative whole-body scan.  -His thyroglobulin level is 0.3 associated with undetectable antithyroglobulin antibodies. -This completes his initial treatment for thyroid cancer.  -Risk of tumor recurrence in the next 5 - 10 years, and the need for consistent follow-up with periodic imaging studies is emphasized to the patient and his wife in exam room.    -His thyroid/neck ultrasound on August 19, 2018 is negative.  -Prior to his last visit  Thyrogen stimulated whole-body scan -normal exam post thyroidectomy and thyroid ablation.  No evidence of local recurrence or metastatic disease.   -His last surveillance thyroid/neck ultrasound is negative for residual or recurrent disease.   -In August 2023, his last thyrogen stimulated whole-body scan is negative for tumor recurrence.    -For his postsurgical hypothyroidism: -His previsit thyroid function tests are consistent with appropriate replacement, advised to continue levothyroxine 175 mcg p.o. daily before breakfast.    . Target TSH for him would be between 0.1-0.5 for up to 5 years.   - We discussed about the correct intake of his thyroid hormone, on empty stomach at fasting, with water, separated by at least 30 minutes from breakfast and other medications,  and separated by more than 4 hours from calcium, iron, multivitamins, acid reflux medications (PPIs). -Patient is made aware of the fact that thyroid hormone replacement is needed for life, dose to be adjusted by periodic monitoring of thyroid function tests.  -His recent CMP showed calcium 9.2 while he was taking calcium supplements.  He is advised to continue calcium carbonate 1250 mg - 500 mg elemental calcium p.o. daily at breakfast.    -  Regarding his vitamin D deficiency: He is  advised to continue vitamin D3 5000 units daily.    His BP is uncontrolled . He is advised to continue  hydrochlorothiazide  25 mg p.o. daily with breakfast, along with his Cozaar 50 mg p.o. daily. He has hyperlipidemia with LDL at  108 only slightly improving from 131.    - he acknowledges that there is a room for improvement in his food and drink choices. - Suggestion is made for him to avoid simple carbohydrates  from his diet including Cakes, Sweet Desserts, Ice Cream, Soda (diet and regular), Sweet Tea, Candies, Chips, Cookies, Store Bought Juices, Alcohol , Artificial Sweeteners,  Coffee Creamer, and "Sugar-free" Products, Lemonade. This will help patient to have more stable blood glucose profile and potentially avoid unintended weight gain.  The following Lifestyle Medicine recommendations according to [place]  [facility]) were discussed and and offered to patient and he  agrees to start the journey:  A. Whole Foods, Plant-Based Nutrition comprising of fruits and vegetables, plant-based proteins, whole-grain carbohydrates was discussed in detail with the patient.   A list for source of those nutrients were also provided to the patient.  Patient will use only water or unsweetened tea for hydration. B.  The need to stay away from risky substances including alcohol, smoking; obtaining 7 to 9 hours of restorative sleep, at least 150 minutes of moderate intensity exercise weekly, the importance of healthy social connections,  and stress management techniques were discussed. C.  A full color page of  Calorie density of various food groups per pound showing examples of each food groups was provided to the patient.  His point-of-care A1c is 5.6% indicating absence of prediabetes/diabetes.  If his LDL remains above 100 mg per DL during next measurement, he should be considered for low-dose statins.  - I advised patient to maintain close follow up with Burdine, Virgina Evener, MD for  primary care needs.   I spent  25  minutes in the care of the patient today including review of labs from Thyroid Function, CMP, and other relevant labs ; imaging/biopsy records (current and previous including abstractions from other facilities); face-to-face time discussing  his lab results and symptoms, medications doses, his options of short and long term treatment based on the latest standards of care / guidelines;   and documenting the encounter.  Anise Salvo  participated in the discussions, expressed understanding, and voiced agreement with the above plans.  All questions were answered to his satisfaction. he is encouraged to contact clinic should he have any questions or concerns prior to his return visit.    Follow up plan: Return in about 6 months (around 03/31/2023) for F/U with Pre-visit Labs.   Glade Lloyd, MD [facility] [address] [place], [place] [address] Phone: [phone]  Fax: [phone]     09/30/2022, 4:27 PM  This note was partially dictated with voice recognition software. Similar sounding words can be transcribed inadequately or may not  be corrected upon review.

## 2022-09-30 NOTE — Patient Instructions (Signed)
                                     Advice for Weight Management  -For most of us the best way to lose weight is by diet management. Generally speaking, diet management means consuming less calories intentionally which over time brings about progressive weight loss.  This can be achieved more effectively by avoiding ultra processed carbohydrates, processed meats, unhealthy fats.    It is critically important to know your numbers: how much calorie you are consuming and how much calorie you need. More importantly, our carbohydrates sources should be unprocessed naturally occurring  complex starch food items.  It is always important to balance nutrition also by  appropriate intake of proteins (mainly plant-based), healthy fats/oils, plenty of fruits and vegetables.   -The American College of Lifestyle Medicine (ACL M) recommends nutrition derived mostly from Whole Food, Plant Predominant Sources example an apple instead of applesauce or apple pie. Eat Plenty of vegetables, Mushrooms, fruits, Legumes, Whole Grains, Nuts, seeds in lieu of processed meats, processed snacks/pastries red meat, poultry, eggs.  Use only water or unsweetened tea for hydration.  The College also recommends the need to stay away from risky substances including alcohol, smoking; obtaining 7-9 hours of restorative sleep, at least 150 minutes of moderate intensity exercise weekly, importance of healthy social connections, and being mindful of stress and seek help when it is overwhelming.    -Sticking to a routine mealtime to eat 3 meals a day and avoiding unnecessary snacks is shown to have a big role in weight control. Under normal circumstances, the only time we burn stored energy is when we are hungry, so allow  some hunger to take place- hunger means no food between appropriate meal times, only water.  It is not advisable to starve.   -It is better to avoid simple carbohydrates including:  Cakes, Sweet Desserts, Ice Cream, Soda (diet and regular), Sweet Tea, Candies, Chips, Cookies, Store Bought Juices, Alcohol in Excess of  1-2 drinks a day, Lemonade,  Artificial Sweeteners, Doughnuts, Coffee Creamers, "Sugar-free" Products, etc, etc.  This is not a complete list.....    -Consulting with certified diabetes educators is proven to provide you with the most accurate and current information on diet.  Also, you may be  interested in discussing diet options/exchanges , we can schedule a visit with Carlos Payne, RDN, CDE for individualized nutrition education.  -Exercise: If you are able: 30 -60 minutes a day ,4 days a week, or 150 minutes of moderate intensity exercise weekly.    The longer the better if tolerated.  Combine stretch, strength, and aerobic activities.  If you were told in the past that you have high risk for cardiovascular diseases, or if you are currently symptomatic, you may seek evaluation by your heart doctor prior to initiating moderate to intense exercise programs.                                  Additional Care Considerations for Diabetes/Prediabetes   -Diabetes  is a chronic disease.  The most important care consideration is regular follow-up with your diabetes care provider with the goal being avoiding or delaying its complications and to take advantage of advances in medications and technology.  If appropriate actions are taken early enough, type 2 diabetes can even be   reversed.  Seek information from the right source.  - Whole Food, Plant Predominant Nutrition is highly recommended: Eat Plenty of vegetables, Mushrooms, fruits, Legumes, Whole Grains, Nuts, seeds in lieu of processed meats, processed snacks/pastries red meat, poultry, eggs as recommended by American College of  Lifestyle Medicine (ACLM).  -Type 2 diabetes is known to coexist with other important comorbidities such as high blood pressure and high cholesterol.  It is critical to control not only the  diabetes but also the high blood pressure and high cholesterol to minimize and delay the risk of complications including coronary artery disease, stroke, amputations, blindness, etc.  The good news is that this diet recommendation for type 2 diabetes is also very helpful for managing high cholesterol and high blood blood pressure.  - Studies showed that people with diabetes will benefit from a class of medications known as ACE inhibitors and statins.  Unless there are specific reasons not to be on these medications, the standard of care is to consider getting one from these groups of medications at an optimal doses.  These medications are generally considered safe and proven to help protect the heart and the kidneys.    - People with diabetes are encouraged to initiate and maintain regular follow-up with eye doctors, foot doctors, dentists , and if necessary heart and kidney doctors.     - It is highly recommended that people with diabetes quit smoking or stay away from smoking, and get yearly  flu vaccine and pneumonia vaccine at least every 5 years.  See above for additional recommendations on exercise, sleep, stress management , and healthy social connections.      

## 2023-01-13 ENCOUNTER — Telehealth: Payer: Self-pay

## 2023-01-13 NOTE — Telephone Encounter (Signed)
Pt's wife called stating they both had been watching their diets and pt has lost 25lbs. States he is concerned about the dosing of his levothyroxine and if it is the appropriate dose since he losing weight.

## 2023-01-14 NOTE — Telephone Encounter (Signed)
Spoke with pt's wife made her aware pt needs to have his labs performed early per Dr.Nida. Understanding voiced.

## 2023-03-30 LAB — LAB REPORT - SCANNED: EGFR: 67

## 2023-04-08 LAB — LIPID PANEL
Cholesterol: 217 — AB (ref 0–200)
HDL: 51 (ref 35–70)
LDL Cholesterol: 146
Triglycerides: 114 (ref 40–160)

## 2023-04-08 LAB — COMPREHENSIVE METABOLIC PANEL: Calcium: 8.7 (ref 8.7–10.7)

## 2023-04-08 LAB — TSH: TSH: 0.81 (ref 0.41–5.90)

## 2023-04-08 LAB — VITAMIN D 25 HYDROXY (VIT D DEFICIENCY, FRACTURES): Vit D, 25-Hydroxy: 65.7

## 2023-04-16 ENCOUNTER — Ambulatory Visit (INDEPENDENT_AMBULATORY_CARE_PROVIDER_SITE_OTHER): Payer: BC Managed Care – PPO | Admitting: "Endocrinology

## 2023-04-16 ENCOUNTER — Encounter: Payer: Self-pay | Admitting: "Endocrinology

## 2023-04-16 VITALS — BP 116/88 | HR 68 | Ht 68.0 in | Wt 235.0 lb

## 2023-04-16 DIAGNOSIS — E890 Postprocedural hypothyroidism: Secondary | ICD-10-CM | POA: Diagnosis not present

## 2023-04-16 DIAGNOSIS — E782 Mixed hyperlipidemia: Secondary | ICD-10-CM

## 2023-04-16 DIAGNOSIS — Z85850 Personal history of malignant neoplasm of thyroid: Secondary | ICD-10-CM

## 2023-04-16 MED ORDER — ROSUVASTATIN CALCIUM 5 MG PO TABS: 5.0000 mg | ORAL_TABLET | Freq: Every day | ORAL | 1 refills | Status: DC

## 2023-04-16 NOTE — Progress Notes (Signed)
04/16/2023, 1:53 PM              Endocrinology follow-up note   Subjective:    Patient ID: Carlos Payne, male    DOB: 10-25-1960, PCP Burdine, Ananias Pilgrim, MD   Past Medical History:  Diagnosis Date   Cancer [facility])    thyroid cancer   Dyspnea    GERD (gastroesophageal reflux disease)    Hypertension    Needle phobia    severe, needs to lie down for sticks   Seizures (HCC)    as a child   Thyroid mass    left   Wears hearing aid in both ears    Past Surgical History:  Procedure Laterality Date   EYE SURGERY     foreign object removed   FINGER SURGERY Right    index   HERNIA REPAIR     age 15   MULTIPLE TOOTH EXTRACTIONS     THYROIDECTOMY Left 08/24/2017   Procedure: LEFT HEMI THYROIDECTOMY;  Surgeon: Newman Pies, MD;  Location: [facility];  Service: ENT;  Laterality: Left;   THYROIDECTOMY N/A 09/04/2017   Procedure: COMPLETION THYROIDECTOMY;  Surgeon: Newman Pies, MD;  Location: [facility] OR;  Service: ENT;  Laterality: N/A;   Social History   Socioeconomic History   Marital status: Married    Spouse name: Not on file   Number of children: Not on file   Years of education: Not on file   Highest education level: Not on file  Occupational History   Not on file  Tobacco Use   Smoking status: Never   Smokeless tobacco: Never  Vaping Use   Vaping status: Never Used  Substance and Sexual Activity   Alcohol use: No   Drug use: No   Sexual activity: Not on file  Other Topics Concern   Not on file  Social History Narrative   Not on file   Social Determinants of Health   Financial Resource Strain: Not on file  Food Insecurity: Not on file  Transportation Needs: Not on file  Physical Activity: Not on file  Stress: Not on file  Social Connections: Not on file   Outpatient Encounter Medications as of 04/16/2023  Medication Sig   rosuvastatin (CRESTOR) 5 MG tablet Take 1 tablet (5 mg total) by mouth daily.    acetaminophen (TYLENOL) 500 MG tablet Take 1,000 mg by mouth every 8 (eight) hours as needed for moderate pain.   calcium carbonate (OS-CAL) 1250 (500 Ca) MG chewable tablet Chew 1 tablet (1,250 mg total) by mouth daily with breakfast.   cetirizine (ZYRTEC) 10 MG tablet Take 10 mg by mouth daily.   Cholecalciferol (VITAMIN D3) 125 MCG (5000 UT) CAPS Take 1 capsule (5,000 Units total) by mouth daily.   hydrochlorothiazide (HYDRODIURIL) 25 MG tablet Take 1 tablet (25 mg total) by mouth daily.   levothyroxine (SYNTHROID) 175 MCG tablet Take 1 tablet (175 mcg total) by mouth daily before breakfast.   losartan (COZAAR) 50 MG tablet daily.   No facility-administered encounter medications on file as of 04/16/2023.   ALLERGIES: No Known Allergies  VACCINATION STATUS:  There is no immunization history on file for this patient.  HPI Carlos Payne is 62  y.o. male who presents today with a medical history as above. he is being seen in follow-up for thyroid cancer status post near total thyroidectomy and I-131 thyroid remnant ablation.   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  Completion surgery on September 04, 2017 with right lobe thyroidectomy showed benign thyroid parenchyma, benign parathyroid gland, with no evidence of malignancy.  -After his last visit with [place] here, he was scheduled to receive Thyrogen stimulated thyroid remnant ablation with I-131 which was delivered on October 22, 2017 followed by whole body scan on November 01, 2017.  His whole body scan was negative for distant metastasis.  Subsequent thyroid/neck ultrasound has been negative for any evidence of residual or recurrent disease.   -Prior to his last visit, the second stimulated whole-body scan was negative for evidence of relapse or recurrence.  -Before his last visit  thyroid/neck ultrasound was also  negative for residual disease. -He remains on low-dose calcium supplement, and levothyroxine 175 mcg p.o. daily before breakfast.      He reports compliance and consistency in taking his medications.  He has no new complaints today.  His previsit her thyroid function tests are consistent with appropriate replacement.  He continues to have severe dyslipidemia, not on treatment.  He is now calcium is 8.7 while he is on low-dose calcium supplement. Previously, his labs showed low PTH of 12.  -He denies exposure to neck radiation.  He presents pounds weight loss since last visit.  His labs show slight improvement in his dyslipidemia.  -He denies new complaints.  -He denies dysphagia, odynophagia, change in his voice.  Review of Systems  Limited as above.  Objective:    BP 116/88   Pulse 68   Ht 5\' 8"  (1.727 m)   Wt 235 lb (106.6 kg)   BMI 35.73 kg/m   Wt Readings from Last 3 Encounters:  04/16/23 235 lb (106.6 kg)  09/30/22 276 lb 9.6 oz (125.5 kg)  03/30/22 282 lb (127.9 kg)    Physical Exam   CMP     Component Value Date/Time   NA 141 09/21/2022 0000   K 4.8 09/21/2022 0000   CL 102 09/21/2022 0000   CO2 24 (A) 09/21/2022 0000   GLUCOSE 116 (H) 08/22/2018 1552   BUN 30 (A) 09/21/2022 0000   CREATININE 1.1 09/21/2022 0000   CREATININE 1.03 08/22/2018 1552   CALCIUM 8.7 04/08/2023 0000   CALCIUM 9.2 05/25/2018 1630   PROT 7.5 08/22/2018 1552   ALBUMIN 4.7 09/21/2022 0000   AST 43 (A) 09/21/2022 0000   ALT 44 (A) 09/21/2022 0000   ALKPHOS 84 09/21/2022 0000   BILITOT 0.8 08/22/2018 1552   GFRNONAA 66 08/30/2020 0000   GFRAA 76 08/30/2020 0000    Lab Results  Component Value Date   TSH 0.81 04/08/2023   TSH 4.26 09/21/2022   TSH 84.680 (H) 03/20/2022   TSH 0.98 09/13/2021   TSH 2.49 03/22/2021   TSH 1.82 08/30/2020   TSH 1.41 02/24/2020   TSH 0.204 (L) 08/22/2018   TSH 2.010 05/25/2018   TSH 1.309 11/01/2017   FREET4 1.04 03/20/2022   FREET4 1.27 (H)  08/18/2019   FREET4 1.00 08/22/2018   FREET4 0.99 05/25/2018   FREET4 1.08 11/01/2017    Lipid Panel     Component Value Date/Time   CHOL 217 (A) 04/08/2023 0000  TRIG 114 04/08/2023 0000   HDL 51 04/08/2023 0000   CHOLHDL 4.3 03/20/2022 0831   VLDL 27 03/20/2022 0831   LDLCALC 146 04/08/2023 0000      October 08, 2017 lab work from outside facility showed calcium 9.3, TSH elevated at 7.3 7, free T4 low normal at 0.93     Review of his surgical pathology from August 24, 2017 showed 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  Tumor capsule was focally invaded by tumor, no extrathyroidal extension, margin free of tumor, lymphatic/vascular invasion present, no lymph nodes were examined.  TNM code p T3a, pNX .  -Thyrogen stimulated remnant ablation  was administered on October 22, 2017 followed by a negative whole-body scan on November 01, 2017.  Ultrasound of neck on August 19, 2018 is negative for tumor recurrence.   Thyrogen stimulated whole-body scan on August 21, 2019  FINDINGS: Physiologic activity within the nasopharynx, liver and bladder. There is no abnormal activity within the neck or elsewhere to suggest local recurrence or metastatic disease.   IMPRESSION: Normal examination post thyroidectomy and thyroid ablation. No evidence of local recurrence or metastatic disease.   Thyroid/neck ultrasound on August 28, 2020 No regional cervical lymphadenopathy.  IMPRESSION: Post total thyroidectomy without evidence of residual or locally recurrent disease.  Assessment & Plan:   1. Thyroid cancer (HCC)-Hurthle cell carcinoma 2. Postsurgical hypothyroidism 3.  Postsurgical hypocalcemia 4. hyperlipidemia  -He has had a stage 3 Hurthle cell carcinoma status post total thyroidectomy on 2 stages.  Left hemithyroidectomy on August 24, 2017 and completion right hemithyroidectomy on September 04, 2017 with  no evidence of malignancy on the right side. 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 -Status post Thyrogen stimulated thyroid remnant ablation with negative whole-body scan.  -His thyroglobulin level is 0.3 associated with undetectable antithyroglobulin antibodies. -This completed his initial treatment for thyroid cancer.  -Risk of tumor recurrence in the next 5 - 10 years, and the need for consistent follow-up with periodic imaging studies is emphasized to the patient and his wife in exam room.    -His thyroid/neck ultrasound on August 19, 2018 is negative.  -Prior to his last visit  Thyrogen stimulated whole-body scan -normal exam post thyroidectomy and thyroid ablation.  No evidence of local recurrence or metastatic disease.   -His last surveillance thyroid/neck ultrasound is negative for residual or recurrent disease.   -In August 2023, his last thyrogen stimulated whole-body scan is negative for tumor recurrence.  -He will be considered for repeat neck ultrasound before his next visit in 6 months.    -For his postsurgical hypothyroidism: -His previsit thyroid function tests are consistent with appropriate replacement.  He is advised to continue levothyroxine 175 mcg p.o. daily before breakfast.    Target TSH for him would be between 0.1-0.5 for up to 5 years.   - We discussed about the correct intake of his thyroid hormone, on empty stomach at fasting, with water, separated by at least 30 minutes from breakfast and other medications,  and separated by more than 4 hours from calcium, iron, multivitamins, acid reflux medications (PPIs). -Patient is made aware of the fact that thyroid hormone replacement is needed for life, dose to be adjusted by periodic monitoring of thyroid function tests.  -His recent CMP showed calcium 8.7  while  he was taking calcium supplements.  He is  advised to continue calcium carbonate 1250 mg - 500 mg elemental calcium p.o. daily at supper.     -Regarding his vitamin D deficiency: He is advised to continue vitamin D3 5000 units daily.  After he finishes his current supply, he may continue maintenance with vitamin D3 2000 units daily.  His BP is uncontrolled . He is advised to continue  hydrochlorothiazide  25 mg p.o. daily with breakfast, along with his Cozaar 50 mg p.o. daily. He has hyperlipidemia with LDL worsening at 146.  He is open to treatment at this time.  I discussed initiated Crestor 5 mg p.o. nightly.  Side effects and precautions discussed with him.   - he acknowledges that there is a room for improvement in his food and drink choices. - Suggestion is made for him to avoid simple carbohydrates  from his diet including Cakes, Sweet Desserts, Ice Cream, Soda (diet and regular), Sweet Tea, Candies, Chips, Cookies, Store Bought Juices, Alcohol , Artificial Sweeteners,  Coffee Creamer, and "Sugar-free" Products, Lemonade. This will help patient to have more stable blood glucose profile and potentially avoid unintended weight gain.  The following Lifestyle Medicine recommendations according to American College of Lifestyle Medicine  [facility]) were discussed and and offered to patient and he  agrees to start the journey:  A. Whole Foods, Plant-Based Nutrition comprising of fruits and vegetables, plant-based proteins, whole-grain carbohydrates was discussed in detail with the patient.   A list for source of those nutrients were also provided to the patient.  Patient will use only water or unsweetened tea for hydration. B.  The need to stay away from risky substances including alcohol, smoking; obtaining 7 to 9 hours of restorative sleep, at least 150 minutes of moderate intensity exercise weekly, the importance of healthy social connections,  and stress management techniques were discussed. C.  A full color page of  Calorie density of various  food groups per pound showing examples of each food groups was provided to the patient.  His prior point-of-care A1c was 5.6%,  indicating absence of prediabetes/diabetes.  I  - I advised patient to maintain close follow up with Burdine, Ananias Pilgrim, MD for primary care needs.    I spent  25  minutes in the care of the patient today including review of labs from Thyroid Function, CMP, and other relevant labs ; imaging/biopsy records (current and previous including abstractions from other facilities); face-to-face time discussing  his lab results and symptoms, medications doses, his options of short and long term treatment based on the latest standards of care / guidelines;   and documenting the encounter.  Jill Alexanders  participated in the discussions, expressed understanding, and voiced agreement with the above plans.  All questions were answered to his satisfaction. he is encouraged to contact clinic should he have any questions or concerns prior to his return visit.   Follow up plan: Return in about 6 months (around 10/14/2023) for Fasting Labs  in AM B4 8, Thyroid / Neck Ultrasound.   Marquis Lunch, MD [facility] [address] [place], [place] [address] Phone: [phone]  Fax: [phone]     04/16/2023, 1:53 PM  This note was partially dictated with voice recognition software. Similar sounding words can be transcribed inadequately or may not  be corrected upon review.

## 2023-07-30 ENCOUNTER — Ambulatory Visit (HOSPITAL_COMMUNITY)
Admission: RE | Admit: 2023-07-30 | Discharge: 2023-07-30 | Disposition: A | Discharge status: Home / Self Care | Payer: No Typology Code available for payment source | Source: Ambulatory Visit | Attending: "Endocrinology | Admitting: "Endocrinology

## 2023-07-30 DIAGNOSIS — Z85850 Personal history of malignant neoplasm of thyroid: Secondary | ICD-10-CM | POA: Insufficient documentation

## 2023-08-05 ENCOUNTER — Other Ambulatory Visit: Payer: Self-pay | Admitting: "Endocrinology

## 2023-09-18 LAB — TSH: TSH: 0.85 (ref 0.41–5.90)

## 2023-09-18 LAB — LIPID PANEL
Cholesterol: 183 (ref 0–200)
EGFR: 89
HDL: 53 (ref 35–70)
LDL Cholesterol: 120
PSA, Total: 0.4
Triglycerides: 54 (ref 40–160)

## 2023-09-24 ENCOUNTER — Ambulatory Visit (INDEPENDENT_AMBULATORY_CARE_PROVIDER_SITE_OTHER): Payer: No Typology Code available for payment source | Admitting: "Endocrinology

## 2023-09-24 ENCOUNTER — Encounter: Payer: Self-pay | Admitting: "Endocrinology

## 2023-09-24 VITALS — BP 130/88 | HR 60 | Ht 68.0 in | Wt 229.8 lb

## 2023-09-24 DIAGNOSIS — E782 Mixed hyperlipidemia: Secondary | ICD-10-CM

## 2023-09-24 DIAGNOSIS — I10 Essential (primary) hypertension: Secondary | ICD-10-CM

## 2023-09-24 DIAGNOSIS — Z85850 Personal history of malignant neoplasm of thyroid: Secondary | ICD-10-CM | POA: Diagnosis not present

## 2023-09-24 DIAGNOSIS — E890 Postprocedural hypothyroidism: Secondary | ICD-10-CM

## 2023-09-24 MED ORDER — ROSUVASTATIN CALCIUM 5 MG PO TABS: 5.0000 mg | ORAL_TABLET | Freq: Every day | ORAL | 1 refills | Status: DC

## 2023-09-24 NOTE — Progress Notes (Signed)
09/24/2023, 10:00 AM              Endocrinology follow-up note   Subjective:    Patient ID: Carlos Payne, male    DOB: 1961/01/07, PCP Burdine, Ananias Pilgrim, MD   Past Medical History:  Diagnosis Date   Cancer [facility])    thyroid cancer   Dyspnea    GERD (gastroesophageal reflux disease)    Hypertension    Needle phobia    severe, needs to lie down for sticks   Seizures (HCC)    as a child   Thyroid mass    left   Wears hearing aid in both ears    Past Surgical History:  Procedure Laterality Date   EYE SURGERY     foreign object removed   FINGER SURGERY Right    index   HERNIA REPAIR     age 36   MULTIPLE TOOTH EXTRACTIONS     THYROIDECTOMY Left 08/24/2017   Procedure: LEFT HEMI THYROIDECTOMY;  Surgeon: Newman Pies, MD;  Location: [facility];  Service: ENT;  Laterality: Left;   THYROIDECTOMY N/A 09/04/2017   Procedure: COMPLETION THYROIDECTOMY;  Surgeon: Newman Pies, MD;  Location: [facility] OR;  Service: ENT;  Laterality: N/A;   Social History   Socioeconomic History   Marital status: Married    Spouse name: Not on file   Number of children: Not on file   Years of education: Not on file   Highest education level: Not on file  Occupational History   Not on file  Tobacco Use   Smoking status: Never   Smokeless tobacco: Never  Vaping Use   Vaping status: Never Used  Substance and Sexual Activity   Alcohol use: No   Drug use: No   Sexual activity: Not on file  Other Topics Concern   Not on file  Social History Narrative   Not on file   Social Drivers of Health   Financial Resource Strain: Not on file  Food Insecurity: Not on file  Transportation Needs: Not on file  Physical Activity: Not on file  Stress: Not on file  Social Connections: Not on file   Outpatient Encounter Medications as of 09/24/2023  Medication Sig   acetaminophen (TYLENOL) 500 MG tablet Take 1,000 mg by mouth every 8 (eight) hours  as needed for moderate pain.   calcium carbonate (OS-CAL) 1250 (500 Ca) MG chewable tablet Chew 1 tablet (1,250 mg total) by mouth daily with breakfast.   cetirizine (ZYRTEC) 10 MG tablet Take 10 mg by mouth daily.   Cholecalciferol (VITAMIN D3) 125 MCG (5000 UT) CAPS Take 1 capsule (5,000 Units total) by mouth daily.   hydrochlorothiazide (HYDRODIURIL) 25 MG tablet Take 1 tablet (25 mg total) by mouth daily.   levothyroxine (SYNTHROID) 175 MCG tablet TAKE 1 TABLET BY MOUTH ONCE DAILY BEFORE  BREAKFAST   losartan (COZAAR) 50 MG tablet daily.   rosuvastatin (CRESTOR) 5 MG tablet Take 1 tablet (5 mg total) by mouth at bedtime.   [DISCONTINUED] rosuvastatin (CRESTOR) 5 MG tablet Take 1 tablet (5 mg total) by mouth daily.   No facility-administered encounter medications on file as of 09/24/2023.   ALLERGIES: No Known Allergies  VACCINATION STATUS:  There is no immunization history on file for this patient.  Carlos Payne is 63 y.o. male who presents today with a medical history as above. he is being seen in follow-up for thyroid cancer status post near total thyroidectomy and I-131 thyroid remnant ablation.   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  Completion surgery on September 04, 2017 with right lobe thyroidectomy showed benign thyroid parenchyma, benign parathyroid gland, with no evidence of malignancy.  -After his last visit with [place] here, he was scheduled to receive Thyrogen stimulated thyroid remnant ablation with I-131 which was delivered on October 22, 2017 followed by whole body scan on November 01, 2017.  His whole body scan was negative for distant metastasis.  Subsequent thyroid/neck ultrasound has been negative for any evidence of residual or recurrent disease.   -Prior to his last visit, the second stimulated whole-body scan was negative for evidence of  relapse or recurrence.  -His most recent thyroid/neck ultrasound from July 30, 2023  was also negative for residual disease. -His recent labs did not include thyroglobulin or thyroglobulin antibodies even though these were ordered during his last visit. -He remains on low-dose calcium supplement, and levothyroxine 175 mcg p.o. daily before breakfast.   He is now calcium is 9.1 while he is on low-dose calcium supplement.   He reports compliance and consistency in taking his medications.  He has no new complaints today.  His previsit her thyroid function tests are consistent with appropriate replacement.  He continues to have severe dyslipidemia, not on treatment.  Previously, his labs showed low PTH of 12.  -He denies exposure to neck radiation.  He presents pounds weight loss since last visit.  His labs show slight improvement in his dyslipidemia. He admits to have been inconsistent taking it, however made some changes in his dietary habits and improved his cholesterol which shows an LDL of 120 improving from 161 during last measurement. -He denies new complaints.  -He denies dysphagia, odynophagia, change in his voice. -He did accept a prescription for low-dose Crestor for hyperlipidemia during his last visit.    Review of Systems  Limited as above.  Objective:    BP 130/88   Pulse 60   Ht 5\' 8"  (1.727 m)   Wt 229 lb 12.8 oz (104.2 kg)   BMI 34.94 kg/m   Wt Readings from Last 3 Encounters:  09/24/23 229 lb 12.8 oz (104.2 kg)  04/16/23 235 lb (106.6 kg)  09/30/22 276 lb 9.6 oz (125.5 kg)    Physical Exam   CMP     Component Value Date/Time   NA 141 09/21/2022 0000   K 4.8 09/21/2022 0000   CL 102 09/21/2022 0000   CO2 24 (A) 09/21/2022 0000   GLUCOSE 116 (H) 08/22/2018 1552   BUN 30 (A) 09/21/2022 0000   CREATININE 1.1 09/21/2022 0000   CREATININE 1.03 08/22/2018 1552   CALCIUM 8.7 04/08/2023 0000   CALCIUM 9.2 05/25/2018 1630   PROT 7.5 08/22/2018 1552   ALBUMIN  4.7 09/21/2022 0000   AST 43 (A) 09/21/2022 0000   ALT 44 (A) 09/21/2022 0000   ALKPHOS 84 09/21/2022 0000   BILITOT 0.8 08/22/2018 1552   GFRNONAA 66 08/30/2020 0000   GFRAA 76 08/30/2020 0000    Lab Results  Component Value Date   TSH 0.85 09/18/2023   TSH 0.81 04/08/2023  TSH 4.26 09/21/2022   TSH 84.680 (H) 03/20/2022   TSH 0.98 09/13/2021   TSH 2.49 03/22/2021   TSH 1.82 08/30/2020   TSH 1.41 02/24/2020   TSH 0.204 (L) 08/22/2018   TSH 2.010 05/25/2018   FREET4 1.04 03/20/2022   FREET4 1.27 (H) 08/18/2019   FREET4 1.00 08/22/2018   FREET4 0.99 05/25/2018   FREET4 1.08 11/01/2017    Lipid Panel     Component Value Date/Time   CHOL 183 09/18/2023 0000   TRIG 54 09/18/2023 0000   HDL 53 09/18/2023 0000   CHOLHDL 4.3 03/20/2022 0831   VLDL 27 03/20/2022 0831   LDLCALC 120 09/18/2023 0000      October 08, 2017 lab work from outside facility showed calcium 9.3, TSH elevated at 7.3 7, free T4 low normal at 0.93     Review of his surgical pathology from August 24, 2017 showed 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  Tumor capsule was focally invaded by tumor, no extrathyroidal extension, margin free of tumor, lymphatic/vascular invasion present, no lymph nodes were examined.  TNM code p T3a, pNX .  -Thyrogen stimulated remnant ablation  was administered on October 22, 2017 followed by a negative whole-body scan on November 01, 2017.  Ultrasound of neck on August 19, 2018 is negative for tumor recurrence.   Thyrogen stimulated whole-body scan on August 21, 2019  FINDINGS: Physiologic activity within the nasopharynx, liver and bladder. There is no abnormal activity within the neck or elsewhere to suggest local recurrence or metastatic disease.   IMPRESSION: Normal examination post thyroidectomy and thyroid ablation. No evidence of local recurrence or metastatic disease.   Thyroid/neck  ultrasound on August 28, 2020 No regional cervical lymphadenopathy.  IMPRESSION: Post total thyroidectomy without evidence of residual or locally recurrent disease.  Assessment & Plan:   1. Thyroid cancer (HCC)-Hurthle cell carcinoma 2. Postsurgical hypothyroidism 3.  Postsurgical hypocalcemia 4. hyperlipidemia  -He has had a stage 3 Hurthle cell carcinoma status post total thyroidectomy on 2 stages.  Left hemithyroidectomy on August 24, 2017 and completion right hemithyroidectomy on September 04, 2017 with no evidence of malignancy on the right side. 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 -Status post Thyrogen stimulated thyroid remnant ablation with negative whole-body scan.  -His thyroglobulin level is 0.3 associated with undetectable antithyroglobulin antibodies. -This completed his initial treatment for thyroid cancer.  -Risk of tumor recurrence in the next 5 - 10 years, and the need for consistent follow-up with periodic imaging studies is emphasized to the patient and his wife in exam room.    -His thyroid/neck ultrasound on August 19, 2018 is negative.  -Prior to his last visit  Thyrogen stimulated whole-body scan -normal exam post thyroidectomy and thyroid ablation.  No evidence of local recurrence or metastatic disease.   -His last surveillance thyroid/neck ultrasound is negative for residual or recurrent disease.   -In August 2023, his last thyrogen stimulated whole-body scan is negative for tumor recurrence.  -His July 30, 2023 thyroid/neck ultrasound was unremarkable.  He will have thyroglobulin/thyroglobulin bodies included during his next blood work.   -For his postsurgical hypothyroidism: -His previsit thyroid function tests are consistent with appropriate replacement.  He is advised to continue levothyroxine 175 mcg p.o. daily before breakfast.    Target TSH for him would  be between 0.1-0.5 for up to 5 years.   - We discussed about the correct intake of his thyroid hormone, on empty stomach at fasting, with water, separated by at least 30 minutes from breakfast and other medications,  and separated by more than 4 hours from calcium, iron, multivitamins, acid reflux medications (PPIs). -Patient is made aware of the fact that thyroid hormone replacement is needed for life, dose to be adjusted by periodic monitoring of thyroid function tests.  -His recent CMP showed calcium 9.1.  He is advised to continue  calcium carbonate 1250 mg - 500 mg elemental calcium p.o. daily at supper.     -Regarding his vitamin D deficiency: He is advised to continue vitamin D3 5000 units daily.  After he finishes his current supply, he may continue maintenance with vitamin D3 2000 units daily.  His BP is better controlled.  He is advised to continue hydrochlorothiazide 25 mg p.o. daily at breakfast along with Cozaar 50 mg p.o. daily.     He did not take Crestor consistently, however by making changes in his diet he has lowered his LDL to 120 from 146.  He is advised to continue on the lifestyle changes along with Crestor 5 mg p.o. nightly.   He will be taken off of statins if he does not tolerate.  - he acknowledges that there is a room for improvement in his food and drink choices. - Suggestion is made for him to avoid simple carbohydrates  from his diet including Cakes, Sweet Desserts, Ice Cream, Soda (diet and regular), Sweet Tea, Candies, Chips, Cookies, Store Bought Juices, Alcohol , Artificial Sweeteners,  Coffee Creamer, and "Sugar-free" Products, Lemonade. This will help patient to have more stable blood glucose profile and potentially avoid unintended weight gain.  The following Lifestyle Medicine recommendations according to American College of Lifestyle Medicine  [facility]) were discussed and and offered to patient and he  agrees to start the journey:  A.  Whole Foods, Plant-Based Nutrition comprising of fruits and vegetables, plant-based proteins, whole-grain carbohydrates was discussed in detail with the patient.   A list for source of those nutrients were also provided to the patient.  Patient will use only water or unsweetened tea for hydration. B.  The need to stay away from risky substances including alcohol, smoking; obtaining 7 to 9 hours of restorative sleep, at least 150 minutes of moderate intensity exercise weekly, the importance of healthy social connections,  and stress management techniques were discussed. C.  A full color page of  Calorie density of various food groups per pound showing examples of each food groups was provided to the patient.  His prior point-of-care A1c was 5.6%,  indicating absence of prediabetes/diabetes.  I  - I advised patient to maintain close follow up with Burdine, Ananias Pilgrim, MD for primary care needs.   I spent  26  minutes in the care of the patient today including review of labs from Thyroid Function, CMP, and other relevant labs ; imaging/biopsy records (current and previous including abstractions from other facilities); face-to-face time discussing  his lab results and symptoms, medications doses, his options of short and long term treatment based on the latest standards of care / guidelines;   and documenting the encounter.  Jill Alexanders  participated in the discussions, expressed understanding, and voiced agreement with the above plans.  All questions were answered to his satisfaction. he is encouraged to contact clinic should he have any questions or concerns prior to his  return visit.    Follow up plan: Return in about 6 months (around 03/23/2024) for Fasting Labs  in AM B4 8.   Marquis Lunch, MD [facility] [address] [place], [place] [address] Phone: [phone]  Fax: [phone]     09/24/2023, 10:00 AM  This note was partially  dictated with voice recognition software. Similar sounding words can be transcribed inadequately or may not  be corrected upon review.

## 2023-10-22 ENCOUNTER — Ambulatory Visit: Payer: BC Managed Care – PPO | Admitting: "Endocrinology

## 2023-11-01 ENCOUNTER — Other Ambulatory Visit: Payer: Self-pay | Admitting: "Endocrinology

## 2023-11-04 ENCOUNTER — Other Ambulatory Visit: Payer: Self-pay

## 2023-11-04 DIAGNOSIS — E890 Postprocedural hypothyroidism: Secondary | ICD-10-CM

## 2023-11-04 MED ORDER — LEVOTHYROXINE SODIUM 175 MCG PO TABS: 175.0000 ug | ORAL_TABLET | Freq: Every day | ORAL | 0 refills | Status: DC

## 2024-03-18 LAB — HEPATIC FUNCTION PANEL
ALT: 28 U/L (ref 10–40)
AST: 32 (ref 14–40)
Alkaline Phosphatase: 73 (ref 25–125)
Bilirubin, Total: 0.9

## 2024-03-18 LAB — COMPREHENSIVE METABOLIC PANEL WITH GFR
Albumin: 4.7 (ref 3.5–5.0)
Calcium: 9.4 (ref 8.7–10.7)
EGFR: 85
Free T4: 1.84 ng/dL
Globulin: 2.6
PTH: 12
TSH: 0.28 — AB (ref 0.41–5.90)

## 2024-03-18 LAB — BASIC METABOLIC PANEL WITH GFR
BUN: 23 — AB (ref 4–21)
CO2: 25 — AB (ref 13–22)
Chloride: 101 (ref 99–108)
Creatinine: 1 (ref 0.6–1.3)
Glucose: 101
Potassium: 4.1 meq/L (ref 3.5–5.1)
Sodium: 141 (ref 137–147)

## 2024-03-18 LAB — LIPID PANEL
Cholesterol: 146 (ref 0–200)
HDL: 50 (ref 35–70)
LDL Cholesterol: 83
Triglycerides: 61 (ref 40–160)

## 2024-03-18 LAB — TSH: TSH: 0.28 — AB (ref 0.41–5.90)

## 2024-03-24 ENCOUNTER — Encounter: Payer: Self-pay | Admitting: "Endocrinology

## 2024-03-24 ENCOUNTER — Ambulatory Visit (INDEPENDENT_AMBULATORY_CARE_PROVIDER_SITE_OTHER): Payer: No Typology Code available for payment source | Admitting: "Endocrinology

## 2024-03-24 VITALS — BP 122/78 | HR 62 | Ht 68.0 in | Wt 230.8 lb

## 2024-03-24 DIAGNOSIS — E782 Mixed hyperlipidemia: Secondary | ICD-10-CM

## 2024-03-24 DIAGNOSIS — E890 Postprocedural hypothyroidism: Secondary | ICD-10-CM

## 2024-03-24 DIAGNOSIS — Z85850 Personal history of malignant neoplasm of thyroid: Secondary | ICD-10-CM

## 2024-03-24 DIAGNOSIS — I10 Essential (primary) hypertension: Secondary | ICD-10-CM

## 2024-03-24 MED ORDER — CALCIUM CARBONATE 1250 (500 CA) MG PO CHEW: 1.0000 | CHEWABLE_TABLET | Freq: Every day | ORAL | 1 refills | Status: AC

## 2024-03-24 NOTE — Progress Notes (Signed)
 03/24/2024, 9:43 AM              Endocrinology follow-up note   Subjective:    Patient ID: Carlos Payne, male    DOB: Jun 21, 1961, PCP Burdine, Elspeth BRAVO, MD   Past Medical History:  Diagnosis Date   Cancer [facility])    thyroid  cancer   Dyspnea    GERD (gastroesophageal reflux disease)    Hypertension    Needle phobia    severe, needs to lie down for sticks   Seizures (HCC)    as a child   Thyroid  mass    left   Wears hearing aid in both ears    Past Surgical History:  Procedure Laterality Date   EYE SURGERY     foreign object removed   FINGER SURGERY Right    index   HERNIA REPAIR     age 44   MULTIPLE TOOTH EXTRACTIONS     THYROIDECTOMY Left 08/24/2017   Procedure: LEFT HEMI THYROIDECTOMY;  Surgeon: Karis Clunes, MD;  Location: [facility];  Service: ENT;  Laterality: Left;   THYROIDECTOMY N/A 09/04/2017   Procedure: COMPLETION THYROIDECTOMY;  Surgeon: Karis Clunes, MD;  Location: [facility] OR;  Service: ENT;  Laterality: N/A;   Social History   Socioeconomic History   Marital status: Married    Spouse name: Not on file   Number of children: Not on file   Years of education: Not on file   Highest education level: Not on file  Occupational History   Not on file  Tobacco Use   Smoking status: Never   Smokeless tobacco: Never  Vaping Use   Vaping status: Never Used  Substance and Sexual Activity   Alcohol use: No   Drug use: No   Sexual activity: Not on file  Other Topics Concern   Not on file  Social History Narrative   Not on file   Social Drivers of Health   Financial Resource Strain: Not on file  Food Insecurity: Not on file  Transportation Needs: Not on file  Physical Activity: Not on file  Stress: Not on file  Social Connections: Not on file   Outpatient Encounter Medications as of 03/24/2024  Medication Sig   acetaminophen  (TYLENOL ) 500 MG tablet Take 1,000 mg by mouth every 8 (eight) hours  as needed for moderate pain.   cetirizine (ZYRTEC) 10 MG tablet Take 10 mg by mouth daily.   Cholecalciferol (VITAMIN D3) 125 MCG (5000 UT) CAPS Take 1 capsule (5,000 Units total) by mouth daily.   hydrochlorothiazide  (HYDRODIURIL ) 25 MG tablet Take 1 tablet (25 mg total) by mouth daily.   levothyroxine  (SYNTHROID ) 175 MCG tablet Take 1 tablet (175 mcg total) by mouth daily before breakfast.   losartan (COZAAR) 50 MG tablet daily.   multivitamin-lutein (OCUVITE-LUTEIN) CAPS capsule Take 1 capsule by mouth 2 (two) times daily.   rosuvastatin  (CRESTOR ) 5 MG tablet Take 1 tablet (5 mg total) by mouth at bedtime.   [DISCONTINUED] calcium  carbonate (OS-CAL) 1250 (500 Ca) MG chewable tablet Chew 1 tablet (1,250 mg total) by mouth daily with breakfast.   calcium  carbonate (OS-CAL) 1250 (500 Ca) MG chewable tablet Chew 1 tablet (1,250 mg total) by mouth daily  with supper.   No facility-administered encounter medications on file as of 03/24/2024.   ALLERGIES: No Known Allergies  VACCINATION STATUS:  There is no immunization history on file for this patient.  HPI Carlos Payne is 63 y.o. male who presents today with a medical history as above. he is being seen in follow-up for thyroid  cancer status post near total thyroidectomy and I-131 thyroid  remnant ablation.   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  Completion surgery on September 04, 2017 with right lobe thyroidectomy showed benign thyroid  parenchyma, benign parathyroid gland, with no evidence of malignancy.  -After his last visit with us  here, he was scheduled to receive Thyrogen  stimulated thyroid  remnant ablation with I-131 which was delivered on October 22, 2017 followed by whole body scan on November 01, 2017.  His whole body scan was negative for distant metastasis.  Subsequent thyroid /neck ultrasound has been  negative for any evidence of residual or recurrent disease.   -The second stimulated whole-body scan was negative for evidence of relapse or recurrence.  -His most recent thyroid /neck ultrasound from July 30, 2023  was also negative for residual disease. -His recent labs did not include thyroglobulin, however continues to have undetectable thyroglobulin antibodies.  He remains on levothyroxine  175 mcg p.o. daily before breakfast.      He is now calcium  is 9.4 while he is on low-dose calcium  supplement.  He continues to have low PTH of 12.   He reports compliance and consistency in taking his medications.  He has no new complaints today.  His previsit her thyroid  function tests are consistent with appropriate suppressive therapy and replacement.   He reports better compliance with his antilipid treatment.   He presents with steady weight, mildly fluctuating .. -He denies new complaints.  -He denies dysphagia, odynophagia, change in his voice. -He did accept a prescription for low-dose Crestor  for hyperlipidemia during his last visit.    Review of Systems  Limited as above.  Objective:    BP 122/78 (BP Location: Left Arm, Patient Position: Sitting, Cuff Size: Large)   Pulse 62   Ht 5' 8 (1.727 m)   Wt 230 lb 12.8 oz (104.7 kg)   BMI 35.09 kg/m   Wt Readings from Last 3 Encounters:  03/24/24 230 lb 12.8 oz (104.7 kg)  09/24/23 229 lb 12.8 oz (104.2 kg)  04/16/23 235 lb (106.6 kg)    Physical Exam   CMP     Component Value Date/Time   NA 141 03/18/2024 0000   K 4.1 03/18/2024 0000   CL 101 03/18/2024 0000   CO2 25 (A) 03/18/2024 0000   GLUCOSE 116 (H) 08/22/2018 1552   BUN 23 (A) 03/18/2024 0000   CREATININE 1.0 03/18/2024 0000   CREATININE 1.03 08/22/2018 1552   CALCIUM  9.4 03/18/2024 0000   CALCIUM  9.2 05/25/2018 1630   PROT 7.5 08/22/2018 1552   ALBUMIN 4.7 03/18/2024 0000   AST 32 03/18/2024 0000   ALT 28 03/18/2024 0000   ALKPHOS 73 03/18/2024 0000   BILITOT  0.8 08/22/2018 1552   GFRNONAA 66 08/30/2020 0000   GFRAA 76 08/30/2020 0000    Lab Results  Component Value Date   TSH 0.28 (A) 03/18/2024   TSH 0.85 09/18/2023   TSH 0.81 04/08/2023   TSH 4.26 09/21/2022   TSH 84.680 (H) 03/20/2022   TSH 0.98 09/13/2021  TSH 2.49 03/22/2021   TSH 1.82 08/30/2020   TSH 1.41 02/24/2020   TSH 0.204 (L) 08/22/2018   FREET4 1.04 03/20/2022   FREET4 1.27 (H) 08/18/2019   FREET4 1.00 08/22/2018   FREET4 0.99 05/25/2018   FREET4 1.08 11/01/2017    Lipid Panel     Component Value Date/Time   CHOL 146 03/18/2024 0000   TRIG 61 03/18/2024 0000   HDL 50 03/18/2024 0000   CHOLHDL 4.3 03/20/2022 0831   VLDL 27 03/20/2022 0831   LDLCALC 83 03/18/2024 0000      October 08, 2017 lab work from outside facility showed calcium  9.3, TSH elevated at 7.3 7, free T4 low normal at 0.93     Review of his surgical pathology from August 24, 2017 showed 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  Tumor capsule was focally invaded by tumor, no extrathyroidal extension, margin free of tumor, lymphatic/vascular invasion present, no lymph nodes were examined.  TNM code p T3a, pNX .  -Thyrogen  stimulated remnant ablation  was administered on October 22, 2017 followed by a negative whole-body scan on November 01, 2017.  Ultrasound of neck on August 19, 2018 is negative for tumor recurrence.   Thyrogen  stimulated whole-body scan on August 21, 2019  FINDINGS: Physiologic activity within the nasopharynx, liver and bladder. There is no abnormal activity within the neck or elsewhere to suggest local recurrence or metastatic disease.   IMPRESSION: Normal examination post thyroidectomy and thyroid  ablation. No evidence of local recurrence or metastatic disease.   Thyroid /neck ultrasound on August 28, 2020 No regional cervical lymphadenopathy.  IMPRESSION: Post total thyroidectomy without  evidence of residual or locally recurrent disease.  Assessment & Plan:   1. Thyroid  cancer (HCC)-Hurthle cell carcinoma 2. Postsurgical hypothyroidism 3.  Postsurgical hypocalcemia 4. hyperlipidemia  -He has had a stage 3 Hurthle cell carcinoma status post total thyroidectomy on 2 stages.  Left hemithyroidectomy on August 24, 2017 and completion right hemithyroidectomy on September 04, 2017 with no evidence of malignancy on the right side. -Hurthle cell carcinoma is a variant of follicular thyroid  carcinoma, known to behave in a more aggressive manner compared to papillary thyroid  is normal. -Status post Thyrogen  stimulated thyroid  remnant ablation with negative whole-body scan.  -His last documented thyroglobulin was 0.3 associated with undetectable antithyroglobulin antibodies. -This completed his initial treatment for thyroid  cancer. He was considered intermediate risk for recurrence, his treatment response was excellent.   -His thyroid /neck ultrasound on August 19, 2018 is negative.  Thyrogen  stimulated whole-body scan -normal exam post thyroidectomy and thyroid  ablation.  No evidence of local recurrence or metastatic disease.   -His last surveillance thyroid /neck ultrasound is negative for residual or recurrent disease.   -In August 2023, his last thyrogen  stimulated whole-body scan is negative for tumor recurrence.  -His July 30, 2023 thyroid /neck ultrasound was unremarkable.  He will have thyroglobulin/thyroglobulin bodies included during his next blood work.   For the second time now his labs did not include thyroglobulin level.  I have emphasized to him that he has to go to [facility] during his next blood work to make sure thyroglobulin and thyroglobulin antibodies are included in the previsit labs.  -For his postsurgical hypothyroidism: -His previsit thyroid  function tests are consistent with appropriate replacement.  He is advised to continue levothyroxine  175 mcg p.o. daily  before breakfast.   Target TSH for him would be between 0.1-0.5 for  up to 5 years.   - We discussed about the correct intake of his thyroid  hormone, on empty stomach at fasting, with water , separated by at least 30 minutes from breakfast and other medications,  and separated by more than 4 hours from calcium , iron, multivitamins, acid reflux medications (PPIs). -Patient is made aware of the fact that thyroid  hormone replacement is needed for life, dose to be adjusted by periodic monitoring of thyroid  function tests.   -His recent CMP showed calcium  9.4.  He is advised to continue  calcium  carbonate 1250 mg - 500 mg elemental calcium  p.o. daily at supper.     -Regarding his vitamin D  deficiency: He is advised to continue with maintenance with vitamin D3 2000 units daily.  His BP is better controlled.  He is advised to continue hydrochlorothiazide  25 mg p.o. daily at breakfast along with Cozaar 50 mg p.o. daily.     He did not take Crestor  consistently, however by making changes in his diet he has lowered his LDL to 83, overall improving from 146 .  He is advised to continue lifestyle changes as well as Crestor  5 mg p.o. nightly.    - he acknowledges that there is a room for improvement in his food and drink choices. - Suggestion is made for him to avoid simple carbohydrates  from his diet including Cakes, Sweet Desserts, Ice Cream, Soda (diet and regular), Sweet Tea, Candies, Chips, Cookies, Store Bought Juices, Alcohol , Artificial Sweeteners,  Coffee Creamer, and Sugar-free Products, Lemonade. This will help patient to have more stable blood glucose profile and potentially avoid unintended weight gain.  The following Lifestyle Medicine recommendations according to American College of Lifestyle Medicine  [facility]) were discussed and and offered to patient and he  agrees to start the journey:  A. Whole Foods, Plant-Based Nutrition comprising of fruits and vegetables, plant-based proteins,  whole-grain carbohydrates was discussed in detail with the patient.   A list for source of those nutrients were also provided to the patient.  Patient will use only water  or unsweetened tea for hydration. B.  The need to stay away from risky substances including alcohol, smoking; obtaining 7 to 9 hours of restorative sleep, at least 150 minutes of moderate intensity exercise weekly, the importance of healthy social connections,  and stress management techniques were discussed. C.  A full color page of  Calorie density of various food groups per pound showing examples of each food groups was provided to the patient.   His prior point-of-care A1c was 5.6%,  indicating absence of prediabetes/diabetes.  I  - I advised patient to maintain close follow up with Burdine, Steven E, MD for primary care needs.   I spent  26  minutes in the care of the patient today including review of labs from Thyroid  Function, CMP, and other relevant labs ; imaging/biopsy records (current and previous including abstractions from other facilities); face-to-face time discussing  his lab results and symptoms, medications doses, his options of short and long term treatment based on the latest standards of care / guidelines;   and documenting the encounter.  Arley Liming  participated in the discussions, expressed understanding, and voiced agreement with the above plans.  All questions were answered to his satisfaction. he is encouraged to contact clinic should he have any questions or concerns prior to his return visit.   Follow up plan: Return in about 3 months (around 06/24/2024) for F/U with Pre-visit Labs.   Ranny Earl, MD [facility] [address] [place], [place] [address] Phone: [phone]  Fax: [phone]     03/24/2024, 9:43 AM  This note was partially dictated with voice recognition software. Similar sounding words can be transcribed inadequately or  may not  be corrected upon review.

## 2024-04-06 ENCOUNTER — Telehealth: Payer: Self-pay | Admitting: "Endocrinology

## 2024-04-06 NOTE — Telephone Encounter (Signed)
 [facility] sent over results from Thyroglobin for review

## 2024-04-06 NOTE — Telephone Encounter (Signed)
 Pt made aware. Understanding voice. Pt asked if you would like him to keep appt in 3 or 6 months.

## 2024-04-21 ENCOUNTER — Other Ambulatory Visit: Payer: Self-pay | Admitting: "Endocrinology

## 2024-04-21 DIAGNOSIS — E890 Postprocedural hypothyroidism: Secondary | ICD-10-CM

## 2024-04-22 ENCOUNTER — Other Ambulatory Visit: Payer: Self-pay | Admitting: "Endocrinology

## 2024-06-28 ENCOUNTER — Ambulatory Visit: Admitting: "Endocrinology

## 2024-07-20 ENCOUNTER — Other Ambulatory Visit: Payer: Self-pay | Admitting: "Endocrinology

## 2024-07-20 DIAGNOSIS — E890 Postprocedural hypothyroidism: Secondary | ICD-10-CM

## 2024-07-21 ENCOUNTER — Other Ambulatory Visit: Payer: Self-pay | Admitting: "Endocrinology

## 2024-08-14 ENCOUNTER — Other Ambulatory Visit: Payer: Self-pay | Admitting: "Endocrinology

## 2024-08-14 DIAGNOSIS — E890 Postprocedural hypothyroidism: Secondary | ICD-10-CM

## 2024-08-16 LAB — COMPREHENSIVE METABOLIC PANEL WITH GFR
ALT: 35 IU/L (ref 0–44)
AST: 33 IU/L (ref 0–40)
Albumin: 4.7 g/dL (ref 3.9–4.9)
Alkaline Phosphatase: 70 IU/L (ref 47–123)
BUN/Creatinine Ratio: 27 — ABNORMAL HIGH (ref 10–24)
BUN: 26 mg/dL (ref 8–27)
Bilirubin Total: 0.9 mg/dL (ref 0.0–1.2)
CO2: 25 mmol/L (ref 20–29)
Calcium: 9.8 mg/dL (ref 8.6–10.2)
Chloride: 100 mmol/L (ref 96–106)
Creatinine, Ser: 0.97 mg/dL (ref 0.76–1.27)
Globulin, Total: 2.5 g/dL (ref 1.5–4.5)
Glucose: 83 mg/dL (ref 70–99)
Potassium: 4.4 mmol/L (ref 3.5–5.2)
Sodium: 139 mmol/L (ref 134–144)
Total Protein: 7.2 g/dL (ref 6.0–8.5)
eGFR: 87 mL/min/1.73

## 2024-08-16 LAB — T4, FREE: Free T4: 1.72 ng/dL (ref 0.82–1.77)

## 2024-08-16 LAB — THYROGLOBULIN ANTIBODY: Thyroglobulin Antibody: <1 [IU]/mL (ref 0.0–0.9)

## 2024-08-16 LAB — TSH: TSH: 0.074 u[IU]/mL — ABNORMAL LOW (ref 0.450–4.500)

## 2024-08-16 LAB — THYROGLOBULIN LEVEL: Thyroglobulin (TG-RIA): <2 ng/mL

## 2024-08-23 ENCOUNTER — Encounter: Payer: Self-pay | Admitting: "Endocrinology

## 2024-08-23 ENCOUNTER — Ambulatory Visit (INDEPENDENT_AMBULATORY_CARE_PROVIDER_SITE_OTHER): Admitting: "Endocrinology

## 2024-08-23 VITALS — BP 130/76 | Resp 18 | Ht 68.0 in | Wt 230.0 lb

## 2024-08-23 DIAGNOSIS — Z85850 Personal history of malignant neoplasm of thyroid: Secondary | ICD-10-CM | POA: Diagnosis not present

## 2024-08-23 DIAGNOSIS — E890 Postprocedural hypothyroidism: Secondary | ICD-10-CM | POA: Diagnosis not present

## 2024-08-23 DIAGNOSIS — E782 Mixed hyperlipidemia: Secondary | ICD-10-CM | POA: Diagnosis not present

## 2024-08-23 MED ORDER — LEVOTHYROXINE SODIUM 150 MCG PO TABS: 150.0000 ug | ORAL_TABLET | Freq: Every day | ORAL | 1 refills | Status: AC

## 2024-08-23 NOTE — Progress Notes (Signed)
 "                                                     08/23/2024, 2:01 PM              Endocrinology follow-up note   Subjective:    Patient ID: Carlos Payne, male    DOB: [birth year], PCP Burdine, Elspeth BRAVO, MD   Past Medical History:  Diagnosis Date   Cancer [facility])    thyroid  cancer   Dyspnea    GERD (gastroesophageal reflux disease)    Hypertension    Needle phobia    severe, needs to lie down for sticks   Seizures (HCC)    as a child   Thyroid  mass    left   Wears hearing aid in both ears    Past Surgical History:  Procedure Laterality Date   EYE SURGERY     foreign object removed   FINGER SURGERY Right    index   HERNIA REPAIR     age 81   MULTIPLE TOOTH EXTRACTIONS     THYROIDECTOMY Left 08/24/2017   Procedure: LEFT HEMI THYROIDECTOMY;  Surgeon: Carlos Clunes, MD;  Location: [facility];  Service: ENT;  Laterality: Left;   THYROIDECTOMY N/A 09/04/2017   Procedure: COMPLETION THYROIDECTOMY;  Surgeon: Carlos Clunes, MD;  Location: [facility] OR;  Service: ENT;  Laterality: N/A;   Social History   Socioeconomic History   Marital status: Married    Spouse name: Not on file   Number of children: Not on file   Years of education: Not on file   Highest education level: Not on file  Occupational History   Not on file  Tobacco Use   Smoking status: Never   Smokeless tobacco: Never  Vaping Use   Vaping status: Never Used  Substance and Sexual Activity   Alcohol use: No   Drug use: No   Sexual activity: Not on file  Other Topics Concern   Not on file  Social History Narrative   Not on file   Social Drivers of Health   Tobacco Use: Low Risk (08/23/2024)   Patient History    Smoking Tobacco Use: Never    Smokeless Tobacco Use: Never    Passive Exposure: Not on file  Financial Resource Strain: Not on file  Food Insecurity: Not on file  Transportation Needs: Not on file  Physical Activity: Not on file  Stress: Not on file  Social Connections: Not on file   Depression (EYV7-0): Not on file  Alcohol Screen: Not on file  Housing: Not on file  Utilities: Not on file  Health Literacy: Not on file   Outpatient Encounter Medications as of 08/23/2024  Medication Sig   acetaminophen  (TYLENOL ) 500 MG tablet Take 1,000 mg by mouth every 8 (eight) hours as needed for moderate pain.   calcium  carbonate (OS-CAL) 1250 (500 Ca) MG chewable tablet Chew 1 tablet (1,250 mg total) by mouth daily with supper.   cetirizine (ZYRTEC) 10 MG tablet Take 10 mg by mouth daily.   Cholecalciferol (VITAMIN D3) 125 MCG (5000 UT) CAPS Take 1 capsule (5,000 Units total) by mouth daily.   hydrochlorothiazide  (HYDRODIURIL ) 25 MG tablet Take 1 tablet (25 mg total) by mouth daily.   losartan (COZAAR) 50 MG tablet daily.   multivitamin-lutein (OCUVITE-LUTEIN) CAPS capsule  Take 1 capsule by mouth 2 (two) times daily.   rosuvastatin  (CRESTOR ) 5 MG tablet Take 1 tablet by mouth once daily   [DISCONTINUED] levothyroxine  (SYNTHROID ) 175 MCG tablet TAKE 1 TABLET BY MOUTH ONCE DAILY BEFORE BREAKFAST   levothyroxine  (SYNTHROID ) 150 MCG tablet Take 1 tablet (150 mcg total) by mouth daily before breakfast.   No facility-administered encounter medications on file as of 08/23/2024.   ALLERGIES: No Known Allergies  VACCINATION STATUS:  There is no immunization history on file for this patient.  HPI Carlos Payne is 64 y.o. male who presents today with a medical history as above. he is being seen in follow-up for thyroid  cancer status post near total thyroidectomy and I-131 thyroid  remnant ablation.   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  Completion surgery on September 04, 2017 with right lobe thyroidectomy showed benign thyroid  parenchyma, benign parathyroid gland, with no evidence of malignancy.  -After his last visit with us  here, he was scheduled to  receive Thyrogen  stimulated thyroid  remnant ablation with I-131 which was delivered on October 22, 2017 followed by whole body scan on November 01, 2017.  His whole body scan was negative for distant metastasis.  Subsequent thyroid /neck ultrasound has been negative for any evidence of residual or recurrent disease.   -The second stimulated whole-body scan was negative for evidence of relapse or recurrence.  -His most recent thyroid /neck ultrasound from July 30, 2023  was also negative for residual disease. -His recent labs did not include thyroglobulin, however continues to have undetectable thyroglobulin antibodies.  He remains on levothyroxine  175 mcg p.o. daily before breakfast.  His previsit thyroid  function tests are consistent with slight over replacement.    He is now calcium  is 9.8 while he is on low-dose calcium  supplement.  He continues to have low PTH of 12.   He reports compliance and consistency in taking his medications.  He has no new complaints today.   He reports better compliance with his antilipid treatment.   He presents with steady weight, mildly fluctuating .. -He denies new complaints.  -He denies dysphagia, odynophagia, change in his voice. -He did accept a prescription for low-dose Crestor  for hyperlipidemia during his last visit.    Review of Systems  Limited as above.  Objective:    BP 130/76   Resp 18   Ht 5' 8 (1.727 m)   Wt 230 lb (104.3 kg)   BMI 34.97 kg/m   Wt Readings from Last 3 Encounters:  08/23/24 230 lb (104.3 kg)  03/24/24 230 lb 12.8 oz (104.7 kg)  09/24/23 229 lb 12.8 oz (104.2 kg)    Physical Exam   CMP     Component Value Date/Time   NA 139 08/08/2024 1404   K 4.4 08/08/2024 1404   CL 100 08/08/2024 1404   CO2 25 08/08/2024 1404   GLUCOSE 83 08/08/2024 1404   GLUCOSE 116 (H) 08/22/2018 1552   BUN 26 08/08/2024 1404   CREATININE 0.97 08/08/2024 1404   CALCIUM  9.8 08/08/2024 1404   CALCIUM  9.2 05/25/2018 1630   PROT 7.2  08/08/2024 1404   ALBUMIN 4.7 08/08/2024 1404   AST 33 08/08/2024 1404   ALT 35 08/08/2024 1404   ALKPHOS 70 08/08/2024 1404   BILITOT 0.9 08/08/2024 1404   GFRNONAA 66 08/30/2020 0000   GFRAA 76 08/30/2020 0000    Lab Results  Component Value Date   TSH 0.074 (L) 08/08/2024   TSH 0.28 (A) 03/18/2024   TSH 0.28 (A) 03/18/2024   TSH 0.85 09/18/2023   TSH 0.81 04/08/2023   TSH 4.26 09/21/2022   TSH 84.680 (H) 03/20/2022   TSH 0.98 09/13/2021   TSH 2.49 03/22/2021   TSH 1.82 08/30/2020   FREET4 1.72 08/08/2024   FREET4 1.84 03/18/2024   FREET4 1.04 03/20/2022   FREET4 1.27 (H) 08/18/2019   FREET4 1.00 08/22/2018   FREET4 0.99 05/25/2018   FREET4 1.08 11/01/2017    Lipid Panel     Component Value Date/Time   CHOL 146 03/18/2024 0000   TRIG 61 03/18/2024 0000   HDL 50 03/18/2024 0000   CHOLHDL 4.3 03/20/2022 0831   VLDL 27 03/20/2022 0831   LDLCALC 83 03/18/2024 0000      October 08, 2017 lab work from outside facility showed calcium  9.3, TSH elevated at 7.3 7, free T4 low normal at 0.93     Review of his surgical pathology from August 24, 2017 showed 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  Tumor capsule was focally invaded by tumor, no extrathyroidal extension, margin free of tumor, lymphatic/vascular invasion present, no lymph nodes were examined.  TNM code p T3a, pNX .  -Thyrogen  stimulated remnant ablation  was administered on October 22, 2017 followed by a negative whole-body scan on November 01, 2017.  Ultrasound of neck on August 19, 2018 is negative for tumor recurrence.   Thyrogen  stimulated whole-body scan on August 21, 2019  FINDINGS: Physiologic activity within the nasopharynx, liver and bladder. There is no abnormal activity within the neck or elsewhere to suggest local recurrence or metastatic disease.   IMPRESSION: Normal examination post thyroidectomy and thyroid  ablation.  No evidence of local recurrence or metastatic disease.   Thyroid /neck ultrasound on August 28, 2020 No regional cervical lymphadenopathy.  IMPRESSION: Post total thyroidectomy without evidence of residual or locally recurrent disease.   Thyroid /neck ultrasound on July 30, 2023 CLINICAL DATA:  Other. History of thyroid  cancer status post thyroidectomy in 2020   EXAM: THYROID  ULTRASOUND   TECHNIQUE: Ultrasound examination of the thyroid  gland and adjacent soft tissues was performed.   COMPARISON:  Most recent thyroid  ultrasound 08/28/2020   FINDINGS: The thyroid  gland is surgically absent. No evidence of thyroid  tissue within the resection bed. No abnormal mass, nodule or lymphadenopathy.   IMPRESSION: Surgical changes of total thyroidectomy without evidence of disease recurrence.     Electronically Signed   By: Wilkie Lent M.D.   On: 07/31/2023 06:32  Assessment & Plan:   1. Thyroid  cancer (HCC)-Hurthle cell carcinoma 2. Postsurgical hypothyroidism 3.  Postsurgical hypocalcemia 4. hyperlipidemia  -He has had a stage 3 Hurthle cell carcinoma status post total thyroidectomy on 2 stages.  Left hemithyroidectomy on August 24, 2017 and completion right hemithyroidectomy on September 04, 2017 with no evidence of malignancy on the right side. -Hurthle cell carcinoma is a variant of follicular thyroid  carcinoma, known to behave in a more aggressive manner compared to papillary thyroid  is normal. -Status post Thyrogen  stimulated thyroid  remnant ablation with negative whole-body scan.  -His last documented thyroglobulin was 0.3 associated with undetectable antithyroglobulin antibodies. -This completed his initial treatment for thyroid  cancer. He was considered intermediate risk for recurrence, his treatment response was excellent.   -His thyroid /neck ultrasound on August 19, 2018 is negative.  Thyrogen  stimulated whole-body  scan -normal exam post thyroidectomy  and thyroid  ablation.  No evidence of local recurrence or metastatic disease.   -His last surveillance thyroid /neck ultrasound is negative for residual or recurrent disease.   -In August 2023, his last thyrogen  stimulated whole-body scan is negative for tumor recurrence.  -His July 30, 2023 thyroid /neck ultrasound was unremarkable.  He will have thyroglobulin/thyroglobulin bodies included during his next blood work.   -His labs show undetectable thyroglobulin.    -For his postsurgical hypothyroidism: -His previsit thyroid  function tests are consistent with slight over replacement.  I discussed and lowered levothyroxine  to 150 mcg p.o. daily before breakfast.     Target TSH for him would be between 0.1-0.5 for up to 5 years.   - We discussed about the correct intake of his thyroid  hormone, on empty stomach at fasting, with water , separated by at least 30 minutes from breakfast and other medications,  and separated by more than 4 hours from calcium , iron, multivitamins, acid reflux medications (PPIs). -Patient is made aware of the fact that thyroid  hormone replacement is needed for life, dose to be adjusted by periodic monitoring of thyroid  function tests.   -His recent CMP showed calcium  9.8.  He is advised to continue  calcium  carbonate 1250 mg - 500 mg elemental calcium  p.o. daily at supper.     -Regarding his vitamin D  deficiency: He is advised to continue with maintenance with vitamin D3 2000 units daily.  His BP is better controlled.  He is advised to continue hydrochlorothiazide  25 mg p.o. daily at breakfast along with Cozaar 50 mg p.o. daily.     He did not take Crestor  consistently, however by making changes in his diet he has lowered his LDL to 83, overall improving from 146 .  He is advised to continue lifestyle changes as well as Crestor  5 mg p.o. nightly.    - he acknowledges that there is a room for improvement in his food and drink choices. - Suggestion is made for him  to avoid simple carbohydrates  from his diet including Cakes, Sweet Desserts, Ice Cream, Soda (diet and regular), Sweet Tea, Candies, Chips, Cookies, Store Bought Juices, Alcohol , Artificial Sweeteners,  Coffee Creamer, and Sugar-free Products, Lemonade. This will help patient to have more stable blood glucose profile and potentially avoid unintended weight gain.    His prior point-of-care A1c was 5.6%,  indicating absence of prediabetes/diabetes.  I  - I advised patient to maintain close follow up with Burdine, Steven E, MD for primary care needs.   I spent  25  minutes in the care of the patient today including review of labs from Thyroid  Function, CMP, and other relevant labs ; imaging/biopsy records (current and previous including abstractions from other facilities); face-to-face time discussing  his lab results and symptoms, medications doses, his options of short and long term treatment based on the latest standards of care / guidelines;   and documenting the encounter.  Arley Liming  participated in the discussions, expressed understanding, and voiced agreement with the above plans.  All questions were answered to his satisfaction. he is encouraged to contact clinic should he have any questions or concerns prior to his return visit. Dear Patient: Feel free to review your progress notes.  If you are reviewing this progress note and have questions about the meaning of /or medical terms being used, please make a note and address it at your next follow-up appointment.  Medical notes are meant to be a communication tool between  medical professionals and require medical terms to be used for efficiency and insurance approval.   Follow up plan: Return in about 6 months (around 02/20/2025) for Fasting Labs  in AM B4 8.   Ranny Earl, MD [facility] [address] [place], [place] [address] Phone: [phone]  Fax: [phone]      08/23/2024, 2:01 PM  This note was partially dictated with voice recognition software. Similar sounding words can be transcribed inadequately or may not  be corrected upon review.  "

## 2025-02-23 ENCOUNTER — Ambulatory Visit: Admitting: "Endocrinology
